# Patient Record
Sex: Female | Born: 1995 | Race: Black or African American | Hispanic: No | Marital: Married | Smoking: Current some day smoker
Health system: Southern US, Community
[De-identification: ages and names within clinical notes are randomized; demographics above are authoritative.]

## PROBLEM LIST (undated history)

## (undated) ENCOUNTER — Inpatient Hospital Stay (HOSPITAL_COMMUNITY): Payer: Self-pay

## (undated) ENCOUNTER — Emergency Department (HOSPITAL_COMMUNITY): Admission: EM | Payer: Medicaid Other | Source: Home / Self Care

## (undated) DIAGNOSIS — D649 Anemia, unspecified: Secondary | ICD-10-CM

## (undated) DIAGNOSIS — N200 Calculus of kidney: Secondary | ICD-10-CM

## (undated) HISTORY — PX: TOOTH EXTRACTION: SUR596

---
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
## 2015-02-24 LAB — OB RESULTS CONSOLE VARICELLA ZOSTER ANTIBODY, IGG: VARICELLA IGG: IMMUNE

## 2015-03-13 ENCOUNTER — Encounter (HOSPITAL_COMMUNITY): Payer: Self-pay | Admitting: *Deleted

## 2015-03-13 ENCOUNTER — Inpatient Hospital Stay (HOSPITAL_COMMUNITY)
Admission: AD | Admit: 2015-03-13 | Discharge: 2015-03-13 | Disposition: A | Discharge status: Home / Self Care | Payer: Medicaid Other | Source: Ambulatory Visit | Attending: Obstetrics and Gynecology | Admitting: Obstetrics and Gynecology

## 2015-03-13 DIAGNOSIS — O9989 Other specified diseases and conditions complicating pregnancy, childbirth and the puerperium: Secondary | ICD-10-CM

## 2015-03-13 DIAGNOSIS — Z3A31 31 weeks gestation of pregnancy: Secondary | ICD-10-CM | POA: Diagnosis not present

## 2015-03-13 DIAGNOSIS — R109 Unspecified abdominal pain: Secondary | ICD-10-CM | POA: Diagnosis not present

## 2015-03-13 DIAGNOSIS — Z87891 Personal history of nicotine dependence: Secondary | ICD-10-CM | POA: Insufficient documentation

## 2015-03-13 DIAGNOSIS — O26899 Other specified pregnancy related conditions, unspecified trimester: Secondary | ICD-10-CM

## 2015-03-13 HISTORY — DX: Anemia, unspecified: D64.9

## 2015-03-13 LAB — URINALYSIS, ROUTINE W REFLEX MICROSCOPIC
Bilirubin Urine: NEGATIVE
Glucose, UA: NEGATIVE mg/dL
Hgb urine dipstick: NEGATIVE
Ketones, ur: NEGATIVE mg/dL
Nitrite: NEGATIVE
PROTEIN: NEGATIVE mg/dL
SPECIFIC GRAVITY, URINE: 1.02 (ref 1.005–1.030)
Urobilinogen, UA: 0.2 mg/dL (ref 0.0–1.0)
pH: 6.5 (ref 5.0–8.0)

## 2015-03-13 LAB — URINE MICROSCOPIC-ADD ON

## 2015-03-13 NOTE — MAU Provider Note (Signed)
History     CSN: [id number]  Arrival date and time: 03/13/15 1258   None     No chief complaint on file.  HPI  19 y.o. G1P0 @ [redacted]w[redacted]d presents to the [place] with c/o of abdominal pain. Denies vaginal bleeding, LOF. Reports good fetal movement    Past Medical History  Diagnosis Date  . Kidney stones   . Anemia     Past Surgical History  Procedure Laterality Date  . Tooth extraction      History reviewed. No pertinent family history.  History  Substance Use Topics  . Smoking status: Former Games developer  . Smokeless tobacco: Not on file  . Alcohol Use: Yes    Allergies: No Known Allergies  Prescriptions prior to admission  Medication Sig Dispense Refill Last Dose  . dicyclomine (BENTYL) 20 MG tablet Take 1 tablet (20 mg total) by mouth every 6 (six) hours as needed for spasms (for abdominal cramping). (Patient not taking: Reported on 10/26/2014) 20 tablet 0   . Doxylamine-Pyridoxine (DICLEGIS) 10-10 MG TBEC Take 10 mg by mouth every 8 (eight) hours as needed. 12 tablet 1   . levofloxacin (LEVAQUIN) 500 MG tablet Take 1 tablet (500 mg total) by mouth daily. (Patient not taking: Reported on 07/29/2014) 7 tablet 0 Completed Course at Unknown time  . ondansetron (ZOFRAN ODT) 8 MG disintegrating tablet Take 1 tablet (8 mg total) by mouth every 8 (eight) hours as needed for nausea or vomiting. (Patient not taking: Reported on 10/26/2014) 20 tablet 0   . promethazine (PHENERGAN) 25 MG tablet Take 0.5-1 tablets (12.5-25 mg total) by mouth every 6 (six) hours as needed for nausea or vomiting. 30 tablet 2 10/23/2014  . traMADol (ULTRAM) 50 MG tablet Take 1 tablet (50 mg total) by mouth every 6 (six) hours as needed. (Patient not taking: Reported on 10/26/2014) 15 tablet 0     Review of Systems  Constitutional: Negative for fever.  Gastrointestinal: Positive for abdominal pain.  All other systems reviewed and are negative.  Physical Exam   Blood pressure 123/62, pulse 110, temperature 98  F (36.7 C), temperature source Oral, resp. rate 18, last menstrual period 08/06/2014.  Physical Exam  Nursing note and vitals reviewed. Constitutional: She is oriented to person, place, and time. She appears well-developed and well-nourished. No distress.  HENT:  Head: Normocephalic and atraumatic.  Cardiovascular: Normal rate.   Respiratory: Effort normal. No respiratory distress.  GI: Soft. There is no tenderness.  Genitourinary: Vagina normal. There is no rash, tenderness, lesion or injury on the right labia. There is no rash, tenderness, lesion or injury on the left labia. No erythema, tenderness or bleeding in the vagina. No foreign body around the vagina. No signs of injury around the vagina. No vaginal discharge found.  Musculoskeletal: Normal range of motion.  Neurological: She is alert and oriented to person, place, and time.  Skin: Skin is warm and dry.  Psychiatric: She has a normal mood and affect. Her behavior is normal. Judgment and thought content normal.  Dilation: Fingertip Effacement (%): Thick Cervical Position: Posterior Station: Ballotable Exam by:: Sarajane Marek, RNC Results for orders placed or performed during the hospital encounter of 03/13/15 (from the past 24 hour(s))  Urinalysis, Routine w reflex microscopic (not at [facility])     Status: Abnormal   Collection Time: 03/13/15  1:08 PM  Result Value Ref Range   Color, Urine YELLOW YELLOW   APPearance CLEAR CLEAR   Specific Gravity, Urine 1.020 1.005 -  1.030   pH 6.5 5.0 - 8.0   Glucose, UA NEGATIVE NEGATIVE mg/dL   Hgb urine dipstick NEGATIVE NEGATIVE   Bilirubin Urine NEGATIVE NEGATIVE   Ketones, ur NEGATIVE NEGATIVE mg/dL   Protein, ur NEGATIVE NEGATIVE mg/dL   Urobilinogen, UA 0.2 0.0 - 1.0 mg/dL   Nitrite NEGATIVE NEGATIVE   Leukocytes, UA SMALL (A) NEGATIVE  Urine microscopic-add on     Status: Abnormal   Collection Time: 03/13/15  1:08 PM  Result Value Ref Range   Squamous Epithelial / LPF RARE RARE    WBC, UA 7-10 <3 WBC/hpf   Bacteria, UA FEW (A) RARE   FHR- category 1 UC- Irregular MAU Course  Procedures  MDM EFM, po hydration, No change in cervical dilation, Pt is feeling better with no complaints of abdominal pain. Encouraged to drink plenty of fluids.  Assessment and Plan  Abdominal pain in pregnancy  Discharge to home Keep next regularly scheduled appt.  Joanne Aguilar 03/13/2015, 1:37 PM

## 2015-03-13 NOTE — MAU Note (Signed)
Urine in lab 

## 2015-03-13 NOTE — MAU Note (Addendum)
C/O constant pain in suprapubic area. "Feels like my bones gonna shatter." Started a couple of week ago. Forgot to mention at her prenatal appt. No changes in vaginal D/C. No bleeding[place]

## 2015-03-13 NOTE — Discharge Instructions (Signed)
Keep your scheduled appointment for prenatal care. Drink 8-10 glasses of water per day. Abdominal Pain During Pregnancy Abdominal pain is common in pregnancy. Most of the time, it does not cause harm. There are many causes of abdominal pain. Some causes are more serious than others. Some of the causes of abdominal pain in pregnancy are easily diagnosed. Occasionally, the diagnosis takes time to understand. Other times, the cause is not determined. Abdominal pain can be a sign that something is very wrong with the pregnancy, or the pain may have nothing to do with the pregnancy at all. For this reason, always tell your health care provider if you have any abdominal discomfort. HOME CARE INSTRUCTIONS  Monitor your abdominal pain for any changes. The following actions may help to alleviate any discomfort you are experiencing:  Do not have sexual intercourse or put anything in your vagina until your symptoms go away completely.  Get plenty of rest until your pain improves.  Drink clear fluids if you feel nauseous. Avoid solid food as long as you are uncomfortable or nauseous.  Only take over-the-counter or prescription medicine as directed by your health care provider.  Keep all follow-up appointments with your health care provider. SEEK IMMEDIATE MEDICAL CARE IF:  You are bleeding, leaking fluid, or passing tissue from the vagina.  You have increasing pain or cramping.  You have persistent vomiting.  You have painful or bloody urination.  You have a fever.  You notice a decrease in your baby's movements.  You have extreme weakness or feel faint.  You have shortness of breath, with or without abdominal pain.  You develop a severe headache with abdominal pain.  You have abnormal vaginal discharge with abdominal pain.  You have persistent diarrhea.  You have abdominal pain that continues even after rest, or gets worse. MAKE SURE YOU:   Understand these instructions.  Will watch  your condition.  Will get help right away if you are not doing well or get worse. Document Released: 08/22/2005 Document Revised: 06/12/2013 Document Reviewed: 03/21/2013 [facility] Patient Information 2015 [place], [place]. This information is not intended to replace advice given to you by your health care provider. Make sure you discuss any questions you have with your health care provider.

## 2015-05-04 ENCOUNTER — Encounter: Payer: Medicaid Other | Admitting: Obstetrics and Gynecology

## 2015-05-06 ENCOUNTER — Inpatient Hospital Stay (HOSPITAL_COMMUNITY)
Admission: AD | Admit: 2015-05-06 | Discharge: 2015-05-06 | Disposition: A | Discharge status: Home / Self Care | Payer: Medicaid Other | Source: Ambulatory Visit | Attending: Obstetrics & Gynecology | Admitting: Obstetrics & Gynecology

## 2015-05-06 ENCOUNTER — Encounter (HOSPITAL_COMMUNITY): Payer: Self-pay | Admitting: *Deleted

## 2015-05-06 DIAGNOSIS — O26893 Other specified pregnancy related conditions, third trimester: Secondary | ICD-10-CM | POA: Diagnosis not present

## 2015-05-06 DIAGNOSIS — Z87442 Personal history of urinary calculi: Secondary | ICD-10-CM | POA: Insufficient documentation

## 2015-05-06 DIAGNOSIS — M25559 Pain in unspecified hip: Secondary | ICD-10-CM | POA: Diagnosis not present

## 2015-05-06 DIAGNOSIS — Z87891 Personal history of nicotine dependence: Secondary | ICD-10-CM | POA: Diagnosis not present

## 2015-05-06 DIAGNOSIS — Z3A39 39 weeks gestation of pregnancy: Secondary | ICD-10-CM | POA: Diagnosis not present

## 2015-05-06 DIAGNOSIS — R102 Pelvic and perineal pain: Secondary | ICD-10-CM | POA: Diagnosis present

## 2015-05-06 DIAGNOSIS — M25551 Pain in right hip: Secondary | ICD-10-CM

## 2015-05-06 DIAGNOSIS — O26899 Other specified pregnancy related conditions, unspecified trimester: Secondary | ICD-10-CM

## 2015-05-06 LAB — URINALYSIS, ROUTINE W REFLEX MICROSCOPIC
Bilirubin Urine: NEGATIVE
GLUCOSE, UA: NEGATIVE mg/dL
Hgb urine dipstick: NEGATIVE
Ketones, ur: NEGATIVE mg/dL
NITRITE: NEGATIVE
PH: 6.5 (ref 5.0–8.0)
Protein, ur: NEGATIVE mg/dL
SPECIFIC GRAVITY, URINE: 1.02 (ref 1.005–1.030)
Urobilinogen, UA: 0.2 mg/dL (ref 0.0–1.0)

## 2015-05-06 LAB — URINE MICROSCOPIC-ADD ON

## 2015-05-06 MED ORDER — PRENATAL VITAMINS 0.8 MG PO TABS
1.0000 | ORAL_TABLET | Freq: Every day | ORAL | Status: DC
Start: 2015-05-06 — End: 2015-05-08

## 2015-05-06 NOTE — MAU Note (Signed)
Pt stated she is having sharp vaginal vaginal pain and hip [pain for a few weeks. Had been getting prenatal care in [facility]. [facility]

## 2015-05-06 NOTE — MAU Provider Note (Signed)
MAU HISTORY AND PHYSICAL  Chief Complaint:  Vaginal Pain and Hip Pain   Joanne Aguilar is a 19 y.o.  G1P0 with IUP at [redacted]w[redacted]d presenting for Vaginal Pain and Hip Pain  Prenatal care at [facility]. Moving to this area and transferring care.  Hip pain present for 1-2 weeks. Right-sided, doesn't radiate. No weakness or numbness. Worse with ambulation, improved with rest. Not worsening.  Experiencing 1 month of sharp pains in groin/vagina. No significant vaginal discharge. No burning. No fever or chills. Worse with movement. No dysuria or hematuria.   Patient states she has been having  none contractions, none vaginal bleeding, intact membranes, with active fetal movement.    Menstrual History: OB History    Gravida Para Term Preterm AB TAB SAB Ectopic Multiple Living   1         0      Patient's last menstrual period was 08/06/2014.      Past Medical History  Diagnosis Date  . Kidney stones   . Anemia     Past Surgical History  Procedure Laterality Date  . Tooth extraction      History reviewed. No pertinent family history.  Social History  Substance Use Topics  . Smoking status: Former Games developer  . Smokeless tobacco: None  . Alcohol Use: Yes     Comment: none since pregnancy     No Known Allergies  Prescriptions prior to admission  Medication Sig Dispense Refill Last Dose  . dicyclomine (BENTYL) 20 MG tablet Take 1 tablet (20 mg total) by mouth every 6 (six) hours as needed for spasms (for abdominal cramping). (Patient not taking: Reported on 10/26/2014) 20 tablet 0   . Doxylamine-Pyridoxine (DICLEGIS) 10-10 MG TBEC Take 10 mg by mouth every 8 (eight) hours as needed. (Patient not taking: Reported on 03/13/2015) 12 tablet 1 Not Taking at Unknown time  . promethazine (PHENERGAN) 25 MG tablet Take 0.5-1 tablets (12.5-25 mg total) by mouth every 6 (six) hours as needed for nausea or vomiting. (Patient not taking: Reported on 03/13/2015) 30 tablet 2 Not Taking at Unknown  time    Review of Systems - Negative except for what is mentioned in HPI.  Physical Exam  Blood pressure 129/76, pulse 115, temperature 98.4 F (36.9 C), temperature source Oral, resp. rate 18, height  (1.6 m), last menstrual period 08/06/2014. GENERAL: Well-developed, well-nourished female in no acute distress.  LUNGS: Clear to auscultation bilaterally.  HEART: Regular rate and rhythm. ABDOMEN: Soft, nontender, nondistended, gravid.  MSK: no greater trochanter tenderness, straight leg negative Neuro: 5/5 lower strength, distal sensation intact, 2+ patellar and achilles reflexes GU: scant discharge, normal cervix, no CMT or adnexal tenderness/fullness EXTREMITIES: Nontender, no edema, 2+ distal pulses. FHT:  130/mod/+a/-d Contractions: none   Labs: No results found for this or any previous visit (from the past 24 hour(s)).  Imaging Studies:  No results found.  Assessment: Joanne Aguilar is  19 y.o. G1P0 at [redacted]w[redacted]d presents with vaginal and hip pain. No neurologic signs symptoms. No constitutional signs/symptoms. Hip pain is mild, think likely secondary to gravidity, low concern for infectious etiology. Vaginal pain appears to be round ligament pain. No symptoms of UTI or vaginitis, and normal SSE.  Plan: - home with belly band, tylenol, warm baths, and infection, prom, and labor precautions - have requested prenatal records and have asked nursing to scan once we receive them - has appointment next week to establish care in our outpatient clinic  Cherrie Gauze [facility] 8/31/201610:11  AM

## 2015-05-08 ENCOUNTER — Inpatient Hospital Stay (HOSPITAL_COMMUNITY)
Admission: AD | Admit: 2015-05-08 | Discharge: 2015-05-09 | DRG: 775 | Disposition: A | Discharge status: Home / Self Care | Payer: Medicaid Other | Source: Ambulatory Visit | Attending: Family Medicine | Admitting: Family Medicine

## 2015-05-08 ENCOUNTER — Encounter (HOSPITAL_COMMUNITY): Payer: Self-pay | Admitting: *Deleted

## 2015-05-08 DIAGNOSIS — Z87891 Personal history of nicotine dependence: Secondary | ICD-10-CM

## 2015-05-08 DIAGNOSIS — Z3A39 39 weeks gestation of pregnancy: Secondary | ICD-10-CM | POA: Diagnosis not present

## 2015-05-08 DIAGNOSIS — Z87442 Personal history of urinary calculi: Secondary | ICD-10-CM

## 2015-05-08 LAB — CBC
HCT: 27.9 % — ABNORMAL LOW (ref 36.0–46.0)
HEMATOCRIT: 28.4 % — AB (ref 36.0–46.0)
HEMOGLOBIN: 9.3 g/dL — AB (ref 12.0–15.0)
Hemoglobin: 9.3 g/dL — ABNORMAL LOW (ref 12.0–15.0)
MCH: 27 pg (ref 26.0–34.0)
MCH: 26.5 pg (ref 26.0–34.0)
MCHC: 33.3 g/dL (ref 30.0–36.0)
MCHC: 32.7 g/dL (ref 30.0–36.0)
MCV: 81.1 fL (ref 78.0–100.0)
MCV: 80.9 fL (ref 78.0–100.0)
PLATELETS: 105 10*3/uL — AB (ref 150–400)
Platelets: 102 10*3/uL — ABNORMAL LOW (ref 150–400)
RBC: 3.44 MIL/uL — ABNORMAL LOW (ref 3.87–5.11)
RBC: 3.51 MIL/uL — ABNORMAL LOW (ref 3.87–5.11)
RDW: 14.7 % (ref 11.5–15.5)
RDW: 14.8 % (ref 11.5–15.5)
WBC: 7 10*3/uL (ref 4.0–10.5)
WBC: 7.5 10*3/uL (ref 4.0–10.5)

## 2015-05-08 LAB — RPR: RPR Ser Ql: NONREACTIVE

## 2015-05-08 LAB — OB RESULTS CONSOLE GBS: GBS: NEGATIVE

## 2015-05-08 LAB — TYPE AND SCREEN
ABO/RH(D): B POS
ANTIBODY SCREEN: NEGATIVE

## 2015-05-08 LAB — AMNISURE RUPTURE OF MEMBRANE (ROM) NOT AT ARMC: Amnisure ROM: POSITIVE

## 2015-05-08 LAB — GROUP B STREP BY PCR: Group B strep by PCR: NEGATIVE

## 2015-05-08 LAB — HEPATITIS B SURFACE ANTIGEN: Hepatitis B Surface Ag: NEGATIVE

## 2015-05-08 LAB — ABO/RH: ABO/RH(D): B POS

## 2015-05-08 MED ORDER — NALOXONE HCL 0.4 MG/ML IJ SOLN
INTRAMUSCULAR | Status: AC
  Filled 2015-05-08: qty 1

## 2015-05-08 MED ORDER — EPHEDRINE 5 MG/ML INJ
10.0000 mg | INTRAVENOUS | Status: DC | PRN
  Filled 2015-05-08: qty 2

## 2015-05-08 MED ORDER — BISACODYL 10 MG RE SUPP: 10.0000 mg | Freq: Every day | RECTAL | Status: DC | PRN

## 2015-05-08 MED ORDER — PROMETHAZINE HCL 25 MG/ML IJ SOLN
25.0000 mg | Freq: Four times a day (QID) | INTRAMUSCULAR | Status: DC | PRN
  Administered 2015-05-08: 25 mg via INTRAVENOUS
  Filled 2015-05-08: qty 1

## 2015-05-08 MED ORDER — OXYCODONE-ACETAMINOPHEN 5-325 MG PO TABS
1.0000 | ORAL_TABLET | ORAL | Status: DC | PRN
  Administered 2015-05-08 – 2015-05-09 (×2): 1 via ORAL
  Filled 2015-05-08: qty 1

## 2015-05-08 MED ORDER — ACETAMINOPHEN 325 MG PO TABS: 650.0000 mg | ORAL_TABLET | ORAL | Status: DC | PRN

## 2015-05-08 MED ORDER — FLEET ENEMA 7-19 GM/118ML RE ENEM
1.0000 | ENEMA | Freq: Every day | RECTAL | Status: DC | PRN
Start: 2015-05-08 — End: 2015-05-09

## 2015-05-08 MED ORDER — OXYTOCIN 40 UNITS IN LACTATED RINGERS INFUSION - SIMPLE MED
62.5000 mL/h | INTRAVENOUS | Status: DC
  Filled 2015-05-08: qty 1000

## 2015-05-08 MED ORDER — DIPHENHYDRAMINE HCL 50 MG/ML IJ SOLN: 12.5000 mg | INTRAMUSCULAR | Status: DC | PRN

## 2015-05-08 MED ORDER — PRENATAL MULTIVITAMIN CH
1.0000 | ORAL_TABLET | Freq: Every day | ORAL | Status: DC
  Administered 2015-05-08 – 2015-05-09 (×2): 1 via ORAL
  Filled 2015-05-08 (×2): qty 1

## 2015-05-08 MED ORDER — ONDANSETRON HCL 4 MG PO TABS: 4.0000 mg | ORAL_TABLET | ORAL | Status: DC | PRN

## 2015-05-08 MED ORDER — ONDANSETRON HCL 4 MG/2ML IJ SOLN: 4.0000 mg | INTRAMUSCULAR | Status: DC | PRN

## 2015-05-08 MED ORDER — OXYCODONE-ACETAMINOPHEN 5-325 MG PO TABS
1.0000 | ORAL_TABLET | ORAL | Status: DC | PRN
  Administered 2015-05-08: 1 via ORAL
  Filled 2015-05-08: qty 1

## 2015-05-08 MED ORDER — LANOLIN HYDROUS EX OINT: TOPICAL_OINTMENT | CUTANEOUS | Status: DC | PRN

## 2015-05-08 MED ORDER — OXYCODONE-ACETAMINOPHEN 5-325 MG PO TABS
2.0000 | ORAL_TABLET | ORAL | Status: DC | PRN
  Filled 2015-05-08 (×2): qty 2

## 2015-05-08 MED ORDER — IBUPROFEN 600 MG PO TABS
600.0000 mg | ORAL_TABLET | Freq: Four times a day (QID) | ORAL | Status: DC
  Administered 2015-05-08 – 2015-05-09 (×5): 600 mg via ORAL
  Filled 2015-05-08 (×5): qty 1

## 2015-05-08 MED ORDER — WITCH HAZEL-GLYCERIN EX PADS: 1.0000 "application " | MEDICATED_PAD | CUTANEOUS | Status: DC | PRN

## 2015-05-08 MED ORDER — MEASLES, MUMPS & RUBELLA VAC ~~LOC~~ INJ: 0.5000 mL | INJECTION | Freq: Once | SUBCUTANEOUS | Status: DC

## 2015-05-08 MED ORDER — FERROUS SULFATE 325 (65 FE) MG PO TABS
325.0000 mg | ORAL_TABLET | Freq: Two times a day (BID) | ORAL | Status: DC
  Administered 2015-05-08 – 2015-05-09 (×2): 325 mg via ORAL
  Filled 2015-05-08 (×2): qty 1

## 2015-05-08 MED ORDER — ZOLPIDEM TARTRATE 5 MG PO TABS: 5.0000 mg | ORAL_TABLET | Freq: Every evening | ORAL | Status: DC | PRN

## 2015-05-08 MED ORDER — BENZOCAINE-MENTHOL 20-0.5 % EX AERO
1.0000 "application " | INHALATION_SPRAY | CUTANEOUS | Status: DC | PRN
  Administered 2015-05-08: 1 via TOPICAL
  Filled 2015-05-08: qty 56

## 2015-05-08 MED ORDER — OXYCODONE-ACETAMINOPHEN 5-325 MG PO TABS: 2.0000 | ORAL_TABLET | ORAL | Status: DC | PRN

## 2015-05-08 MED ORDER — SENNOSIDES-DOCUSATE SODIUM 8.6-50 MG PO TABS
2.0000 | ORAL_TABLET | ORAL | Status: DC
  Administered 2015-05-08: 2 via ORAL
  Filled 2015-05-08: qty 2

## 2015-05-08 MED ORDER — TETANUS-DIPHTH-ACELL PERTUSSIS 5-2.5-18.5 LF-MCG/0.5 IM SUSP
0.5000 mL | Freq: Once | INTRAMUSCULAR | Status: AC
  Administered 2015-05-09: 0.5 mL via INTRAMUSCULAR
  Filled 2015-05-08: qty 0.5

## 2015-05-08 MED ORDER — METHYLERGONOVINE MALEATE 0.2 MG PO TABS: 0.2000 mg | ORAL_TABLET | ORAL | Status: DC | PRN

## 2015-05-08 MED ORDER — OXYTOCIN BOLUS FROM INFUSION
500.0000 mL | INTRAVENOUS | Status: DC
  Administered 2015-05-08: 500 mL via INTRAVENOUS

## 2015-05-08 MED ORDER — PHENYLEPHRINE 40 MCG/ML (10ML) SYRINGE FOR IV PUSH (FOR BLOOD PRESSURE SUPPORT)
80.0000 ug | PREFILLED_SYRINGE | INTRAVENOUS | Status: DC | PRN
  Filled 2015-05-08: qty 2

## 2015-05-08 MED ORDER — OXYTOCIN 40 UNITS IN LACTATED RINGERS INFUSION - SIMPLE MED: 62.5000 mL/h | INTRAVENOUS | Status: DC | PRN

## 2015-05-08 MED ORDER — LACTATED RINGERS IV SOLN
INTRAVENOUS | Status: DC
  Administered 2015-05-08 (×2): via INTRAVENOUS

## 2015-05-08 MED ORDER — DIPHENHYDRAMINE HCL 25 MG PO CAPS: 25.0000 mg | ORAL_CAPSULE | Freq: Four times a day (QID) | ORAL | Status: DC | PRN

## 2015-05-08 MED ORDER — LIDOCAINE HCL (PF) 1 % IJ SOLN
30.0000 mL | INTRAMUSCULAR | Status: DC | PRN
  Administered 2015-05-08: 30 mL via SUBCUTANEOUS
  Filled 2015-05-08: qty 30

## 2015-05-08 MED ORDER — SIMETHICONE 80 MG PO CHEW: 80.0000 mg | CHEWABLE_TABLET | ORAL | Status: DC | PRN

## 2015-05-08 MED ORDER — FENTANYL CITRATE (PF) 100 MCG/2ML IJ SOLN
100.0000 ug | INTRAMUSCULAR | Status: DC | PRN
  Administered 2015-05-08 (×3): 100 ug via INTRAVENOUS
  Filled 2015-05-08 (×3): qty 2

## 2015-05-08 MED ORDER — ONDANSETRON HCL 4 MG/2ML IJ SOLN: 4.0000 mg | Freq: Four times a day (QID) | INTRAMUSCULAR | Status: DC | PRN

## 2015-05-08 MED ORDER — DIBUCAINE 1 % RE OINT: 1.0000 "application " | TOPICAL_OINTMENT | RECTAL | Status: DC | PRN

## 2015-05-08 MED ORDER — METHYLERGONOVINE MALEATE 0.2 MG/ML IJ SOLN: 0.2000 mg | INTRAMUSCULAR | Status: DC | PRN

## 2015-05-08 MED ORDER — LACTATED RINGERS IV SOLN: 500.0000 mL | INTRAVENOUS | Status: DC | PRN

## 2015-05-08 MED ORDER — FENTANYL 2.5 MCG/ML BUPIVACAINE 1/10 % EPIDURAL INFUSION (WH - ANES): 14.0000 mL/h | INTRAMUSCULAR | Status: DC | PRN

## 2015-05-08 MED ORDER — CITRIC ACID-SODIUM CITRATE 334-500 MG/5ML PO SOLN: 30.0000 mL | ORAL | Status: DC | PRN

## 2015-05-08 NOTE — MAU Note (Signed)
PT SAYS  SHE THINKS  SROM AT 0022-  WOKE  UP AND  WAS WET.   PNC-   IN CLINIC - IS  A TRANSFER  FROM  [facility]-  FIRST APPOINTMENT   IS   Tuesday.    NO VE.   DENIES HSV AND MRSA.   GBS- UNSURE.

## 2015-05-08 NOTE — H&P (Signed)
LABOR ADMISSION HISTORY AND PHYSICAL  Joanne Aguilar is a 19 y.o. female G1P0 with IUP at [redacted]w[redacted]d by unknown dating presenting for ROM. She endorses feeling a gush of fluid earlier this evening, with continuous trickling afterwards. She also reports some vaginal bleeding. She reports +FM, + contractions,  no blurry vision, headaches or peripheral edema, and RUQ pain.  She plans on bottle feeding. She request Depo for birth control. Patient has received care in [place], and recently moved to [place]. Her records have not been transferred from her prior prenatal care office.   Dating: By unknown --->  Estimated Date of Delivery: 05/13/15  Sono:  No records available   Prenatal History/Complications:  Past Medical History: Past Medical History  Diagnosis Date  . Kidney stones   . Anemia     Past Surgical History: Past Surgical History  Procedure Laterality Date  . Tooth extraction      Obstetrical History: OB History    Gravida Para Term Preterm AB TAB SAB Ectopic Multiple Living   1         0      Social History: Social History   Social History  . Marital Status: Single    Spouse Name: N/A  . Number of Children: N/A  . Years of Education: N/A   Social History Main Topics  . Smoking status: Former Games developer  . Smokeless tobacco: None  . Alcohol Use: Yes     Comment: none since pregnancy  . Drug Use: Yes    Special: Marijuana     Comment: None since before pregnancy  . Sexual Activity: Yes    Birth Control/ Protection: None   Other Topics Concern  . None   Social History Narrative    Family History: History reviewed. No pertinent family history.  Allergies: No Known Allergies  Prescriptions prior to admission  Medication Sig Dispense Refill Last Dose  . Prenatal Multivit-Min-Fe-FA (PRENATAL VITAMINS) 0.8 MG tablet Take 1 tablet by mouth daily. 30 tablet 12 Past Month at Unknown time     Review of Systems   All systems reviewed and negative except as  stated in HPI  BP 121/75 mmHg  Pulse 86  Temp(Src) 98.2 F (36.8 C) (Oral)  Resp 16  Ht 5\' 3"  (1.6 m)  Wt 155 lb (70.308 kg)  BMI 27.46 kg/m2  LMP 08/06/2014 General appearance: alert, cooperative and mild distress Lungs: clear to auscultation bilaterally Heart: regular rate and rhythm Abdomen: soft, non-tender; bowel sounds normal Pelvic: copious clear fluid mixed with mucous; no blood noted Extremities: no sign of DVT, edema Presentation: cephalic Fetal monitoringBaseline: 135 bpm, Variability: Good {> 6 bpm) and Accelerations: Reactive Uterine activityDate/time of onset: 05/07/15, Frequency: Every 3-5 minutes, Duration: 60 seconds and Intensity: mild Dilation: 3 Effacement (%): 70 Station: -2 Exam by:: DR LANCASTER/ DCALLAWAY,  RN   Prenatal labs: no records available RPR: NON REAC (11/24 0026)  HBsAg:   pending HIV: NONREACTIVE (11/24 0026)  GBS:   pending   Prenatal Transfer Tool  Maternal Substance Abuse:  No Significant Maternal Medications:  None  Results for orders placed or performed during the hospital encounter of 05/08/15 (from the past 24 hour(s))  Amnisure rupture of membrane (rom)not at [facility]   Collection Time: 05/08/15  2:10 AM  Result Value Ref Range   Amnisure ROM POSITIVE     Patient Active Problem List   Diagnosis Date Noted  . Normal labor and delivery 05/08/2015  . Normal delivery at term 05/08/2015  Assessment: Joanne Aguilar is a 19 y.o. G1P0 at [redacted]w[redacted]d here for ROM and SOL.  #Labor: expectant management #Pain: IV pain meds PRN (does not want epidural) #FWB: Category 1 #ID:  GBS pending - will tx with PCN if positive #MOF: Bottle #MOC:Depo  - GBS, Hep B surface antigen pending - Will contact patient's OB in AM to obtain records   Tarri Abernethy, MD PGY-1 [facility]  I have seen and examined this patient and agree the above assessment.    CRESENZO-DISHMAN,Tacie Mccuistion 05/08/2015 8:27 AM

## 2015-05-08 NOTE — MAU Note (Signed)
Pt reports ? Leaking fluid and contractions.

## 2015-05-08 NOTE — Progress Notes (Signed)
Called [facility] at 9:33 AM- unable to speak to provider or RN Ph#[phone] Left message on Rn Line.   Federico Flake, MD

## 2015-05-08 NOTE — Progress Notes (Signed)
Labor Progress Note  Joanne Aguilar is a 19 y.o. G1P0 at [redacted]w[redacted]d  admitted for rupture of membranes  S: Patient pain controlled on fentanyl. Contractions becoming more frequent and stronger.   O:  BP 140/84 mmHg  Pulse 88  Temp(Src) 97.5 F (36.4 C) (Oral)  Resp 16  Ht  (1.6 m)  Wt 155 lb (70.308 kg)  BMI 27.46 kg/m2  LMP 08/06/2014     FHT:  FHR: 140 bpm, variability: moderate,  accelerations:  Present,  decelerations:  Absent UC:   regular, every 2-5 minutes SVE:   Dilation: 3 Effacement (%): 70 Station: -2 Exam by:: DR Lakindra Wible/ DCALLAWAY,  RN SROM/AROM: clear fluid; ruptured before presentation to MAU (possibly around midnight)  Pitocin not yet started   Labs: Lab Results  Component Value Date   WBC 7.0 05/08/2015   HGB 9.3* 05/08/2015   HCT 27.9* 05/08/2015   MCV 81.1 05/08/2015   PLT 105* 05/08/2015    Assessment / Plan: 19 y.o. G1P0 [redacted]w[redacted]d in early labor. Spontaneous labor, progressing normally  Labor: Progressing normally Fetal Wellbeing:  Category I Pain Control:  Fentanyl Anticipated MOD:  NSVD  Expectant management Will contact patient's [facility] provider in AM to obtain records   Tarri Abernethy, MD PGY-1 [facility]

## 2015-05-09 LAB — HIV ANTIBODY (ROUTINE TESTING W REFLEX): HIV Screen 4th Generation wRfx: NONREACTIVE

## 2015-05-09 LAB — RUBELLA SCREEN: RUBELLA: 1.6 {index} (ref >0.99)

## 2015-05-09 MED ORDER — IBUPROFEN 600 MG PO TABS: 600.0000 mg | ORAL_TABLET | Freq: Four times a day (QID) | ORAL | Status: DC | PRN

## 2015-05-09 NOTE — Clinical Social Work Maternal (Signed)
  CLINICAL SOCIAL WORK MATERNAL/CHILD NOTE  Patient Details  Name: Joanne Aguilar MRN: [id number] Date of Birth: 10/23/1995  Date:  05/09/2015  Clinical Social Worker Initiating Note:  Norlene Duel, LCSW Date/ Time Initiated:  05/09/15/1045     Child's Name:  Maudry Diego   Legal Guardian:   (Parents Joanne Aguilar and Joanne Aguilar)   Need for Interpreter:  None   Date of Referral:  05/08/15     Reason for Referral:  Other (Comment)   Referral Source:  [facility]   Address:  [address] [place], [place] [address]  Phone number:   [phone])   Household Members:  Relatives   Natural Supports (not living in the home):  Extended Family, Immediate Family   Professional Supports: None   Employment:  (FOB is employed)   Type of Work:     Education:      Pensions consultant:  [employer]   Other Resources:  [employer]   Cultural/Religious Considerations Which May Impact Care:  none noted  Strengths:  Ability to meet basic needs , Home prepared for child    Risk Factors/Current Problems:   (Hx of marijuana use)   Cognitive State:  Able to Concentrate , Alert    Mood/Affect:  Happy    CSW Assessment:  Acknowledged order for social work consult to assess mother's hx of marijuana use.  Met with mother who was pleasant and receptive to CSW.    She is a single parent with no other dependents.    Her sister was present and attentive.   FOB is employed, but mother states that she is unsure of whether he will support newborn.    She admits to occasional use of marijuana prior to the pregnancy.   She denies any need for treatment.     She denies any hx of alcohol abuse or other illicit drug use.    UDS on newborn is pending.   Mother denies any hx of mental illness.  Informed that she is well prepared at home for newborn.  Mother informed of social work Fish farm manager.    CSW Plan/Description:     Mother informed of the hospital's drug screening policy No barriers to  discharge Will continue to monitor drug screen.  Malisa Ruggiero J, LCSW 05/09/2015, 12:51 PM

## 2015-05-09 NOTE — Discharge Summary (Signed)
Physician Discharge Summary  Patient ID: Joanne Aguilar MRN: [id number] DOB/AGE: [birth year] 19 y.o.  Admit date: 05/08/2015 Discharge date: 05/09/2015  Admission Diagnoses: Spontaneous onset of labor   Discharge Diagnoses:  Principal Problem:   NSVD (normal spontaneous vaginal delivery) Active Problems:   Normal delivery at term   Discharged Condition: good  Hospital Course:   Delivery Note At 7:50 AM a viable female was delivered via Vaginal, Spontaneous Delivery (Presentation: Right Occiput Anterior). APGAR: 9, 9; weight pending,After 3 minutes, the cord was cut and clamped. 40 units of pitocin diluted in 1000cc LR was infused rapidly IV. The placenta separated spontaneously and delivered via CCT and maternal pushing effort. It was inspected and appears to be intact with a 3 VC.   Anesthesia: None  Episiotomy: None Lacerations: 2nd degree;Perineal Suture Repair: 2.0 vicryl Est. Blood Loss (mL): 200  Mom to postpartum. Baby to Couplet care / Skin to Skin.  Joanne Aguilar is a 19 y.o. G1P1001 s/p NSVD.  Patient was admitted in SOL. She had been receiving PNC in [place] but is now living in [place]. No records were able to be obtained from her provider. She has postpartum course that was uncomplicated including no problems with ambulating, PO intake, urination, pain, or bleeding. The pt feels ready to go home and  will be discharged with outpatient follow-up.   Today: No acute events overnight.  Pt denies problems with ambulating, voiding or po intake.  She denies nausea or vomiting.  Pain is well controlled.  She has had flatus. She has not had bowel movement.  Lochia Small.  Plan for birth control is  Depo-Provera.  Method of Feeding: Breast and Bottle.  Discharge Exam: Blood pressure 114/67, pulse 75, temperature 98 F (36.7 C), temperature source Oral, resp. rate 18, height 5\' 3"  (1.6 m), weight 155 lb (70.308 kg), last menstrual period 08/06/2014, unknown if currently  breastfeeding. General: alert, cooperative and no distress Lochia: appropriate Uterine Fundus: firm DVT Evaluation: No evidence of DVT seen on physical exam.  Disposition: 01-Home or Self Care      Discharge Instructions    Activity as tolerated    Complete by:  As directed      Call MD for:  difficulty breathing, headache or visual disturbances    Complete by:  As directed      Call MD for:  extreme fatigue    Complete by:  As directed      Call MD for:  hives    Complete by:  As directed      Call MD for:  persistant dizziness or light-headedness    Complete by:  As directed      Call MD for:  persistant nausea and vomiting    Complete by:  As directed      Call MD for:  severe uncontrolled pain    Complete by:  As directed      Call MD for:  temperature >100.4    Complete by:  As directed      Driving restriction     Complete by:  As directed   Avoid driving for at least 2 weeks.     Lifting restrictions    Complete by:  As directed   Weight restriction of 15 lbs.     Sexual acrtivity    Complete by:  As directed   Pelvic rest for six weeks (no tampons or intercourse)            Medication List  TAKE these medications        ibuprofen 600 MG tablet  Commonly known as:  ADVIL,MOTRIN  Take 1 tablet (600 mg total) by mouth every 6 (six) hours as needed.       Follow-up Information    Follow up with [facility]. Schedule an appointment as soon as possible for a visit in 6 weeks.   Why:  For hospital follow-up   Contact information:   [address] [place] [address] [phone]      Signed: Tarri Abernethy, MD PGY-1 [facility]  05/09/2015, 6:36 AM  CNM attestation I have seen and examined this patient and agree with above documentation in the resident's note.   Joanne Aguilar is a 19 y.o. G1P1001 s/p SVD.   Pain is well controlled.  Plan for birth control is Depo-Provera.  Method of Feeding:  bottle   PE:  BP 114/67 mmHg  Pulse 75  Temp(Src) 98 F (36.7 C) (Oral)  Resp 18  Ht  (1.6 m)  Wt 70.308 kg (155 lb)  BMI 27.46 kg/m2  LMP 08/06/2014  Breastfeeding? Unknown Fundus firm   Recent Labs  05/08/15 0302 05/08/15 0705  HGB 9.3* 9.3*  HCT 27.9* 28.4*     Plan: discharge today - postpartum care discussed - f/u clinic in 6 weeks for postpartum visit- pt is now living in [place] and will be seen at the [facility] for her PP visit  Cam Hai, CNM 8:58 AM  05/09/2015

## 2015-05-09 NOTE — Discharge Instructions (Signed)

## 2015-05-12 ENCOUNTER — Encounter: Payer: Self-pay | Admitting: Advanced Practice Midwife

## 2015-05-13 ENCOUNTER — Encounter: Payer: Self-pay | Admitting: Family

## 2015-05-13 ENCOUNTER — Ambulatory Visit (INDEPENDENT_AMBULATORY_CARE_PROVIDER_SITE_OTHER): Payer: Medicaid Other | Admitting: Family

## 2015-05-13 VITALS — BP 134/89 | HR 72 | Ht 62.4 in | Wt 132.2 lb

## 2015-05-13 DIAGNOSIS — Z3202 Encounter for pregnancy test, result negative: Secondary | ICD-10-CM

## 2015-05-13 DIAGNOSIS — Z3009 Encounter for other general counseling and advice on contraception: Secondary | ICD-10-CM | POA: Diagnosis not present

## 2015-05-13 DIAGNOSIS — Z3049 Encounter for surveillance of other contraceptives: Secondary | ICD-10-CM | POA: Diagnosis not present

## 2015-05-13 DIAGNOSIS — Z113 Encounter for screening for infections with a predominantly sexual mode of transmission: Secondary | ICD-10-CM | POA: Diagnosis not present

## 2015-05-13 DIAGNOSIS — Z3042 Encounter for surveillance of injectable contraceptive: Secondary | ICD-10-CM

## 2015-05-13 MED ORDER — MEDROXYPROGESTERONE ACETATE 150 MG/ML IM SUSP
150.0000 mg | Freq: Once | INTRAMUSCULAR | Status: AC
  Administered 2015-05-13: 150 mg via INTRAMUSCULAR

## 2015-05-13 NOTE — Progress Notes (Signed)
Adolescent Medicine Consultation Initial Visit Joanne Aguilar  is a 19 y.o. female referred by No PCP Per Patient here today for evaluation of contraception management; she was visiting   Previsit planning completed:  no  Growth Chart Viewed? no   PCP Confirmed?  no   History was provided by the patient.  HPI:  Had 2 shots of Depo previously, stopped taking it because she was not having sex.  Delivered SVD baby girl on 05/08/15 without incident. PMH significant only for SVD and kidney stones prior to pregnancy.  She presents today with daughter for the NB check; she inquires about Depo today.  She is breastfeeding.  She is not interested in Nexplanon or IUD at this time; would like Depo.  She has not had intercourse since delivery. No other complaints or pains at this time.  No FH or PMH of cancers, DVTs, clotting disorders, or migraines.   No LMP recorded. - She is 5 days postpartum.   Review of Systems  Constitutional: Negative.   HENT: Negative.   Eyes: Negative.   Respiratory: Negative.   Cardiovascular: Negative.   Gastrointestinal: Negative.   Genitourinary: Negative.        Describes normal lochia   Musculoskeletal: Negative.   Skin: Negative.   Neurological: Negative.   Endo/Heme/Allergies: Negative.   Psychiatric/Behavioral: Negative.     The following portions of the patient's history were reviewed and updated as appropriate: allergies, current medications, past family history, past medical history, past social history, past surgical history and problem list.  No Known Allergies  Past Medical History:   Past Medical History  Diagnosis Date  . Kidney stones   . Anemia     Family History: Not contributory; no contraindications for Depo today.   Social History: Lives with: mom and baby  Parental relations: good Siblings: sisters (3) Friends/Peers: not many School: not currently, went to [employer] Plans: work, no idea where at this  time Nutrition/Eating Behaviors: regular, no issues Sports/Exercise:  walking Screen time: phone and TV, throughout day (on phone during OV) Sleep: sleeping when baby sleeps  Confidentiality was discussed with the patient and if applicable, with caregiver as well.  Patient's personal or confidential phone number: [phone] Tobacco? no Secondhand smoke exposure?no Drugs/EtOH?no Sexually active?no Pregnancy Prevention: Depo today, she is postpartum, reviewed condoms & plan B Safe at home, in school & in relationships? Yes Safe to self? Yes  Physical Exam:  Filed Vitals:   05/13/15 1143  Height: 5' 2.4" (1.585 m)  Weight: 132 lb 3.2 oz (59.966 kg)   Ht 5' 2.4" (1.585 m)  Wt 132 lb 3.2 oz (59.966 kg)  BMI 23.87 kg/m2 Body mass index: body mass index is 23.87 kg/(m^2). Blood pressure percentiles are 99% systolic and 99% diastolic based on 2000 NHANES data. Blood pressure percentile targets: 90: 122/77, 95: 125/81, 99 + 5 mmHg: 138/94.  Physical Exam  Constitutional: She is oriented to person, place, and time. She appears well-developed. No distress.  HENT:  Head: Normocephalic and atraumatic.  Eyes: EOM are normal. Pupils are equal, round, and reactive to light. No scleral icterus.  Neck: Normal range of motion. Neck supple. No thyromegaly present.  Cardiovascular: Normal rate, regular rhythm, normal heart sounds and intact distal pulses.   No murmur heard. Pulmonary/Chest: Effort normal and breath sounds normal.  Abdominal: Soft.  Musculoskeletal: Normal range of motion. She exhibits no edema.  Lymphadenopathy:    She has no cervical adenopathy.  Neurological: She is alert and oriented  to person, place, and time. No cranial nerve deficit.  Skin: Skin is warm and dry. No rash noted.  Psychiatric: She has a normal mood and affect. Her behavior is normal. Judgment and thought content normal.    Assessment/Plan: 1. Contraceptive education Discussed options; she desires to  return to Depo.  Discussed any barriers to returning to clinic for future injections; she states she will be able to return.  Information provided for LARCs should she choose to switch methods.  Condoms provided   2. Encounter for management and injection of depo-Provera -- medroxyPROGESTERone (DEPO-PROVERA) injection 150 mg; Inject 1 mL (150 mg total) into the muscle once.  3. Pregnancy examination or test, negative result Deferred due to postpartum x 5 days  4. Screening examination for venereal disease Per protocol; she was negative at time of deliveyr - GC/chlamydia probe amp, urine  5. NSVD (normal spontaneous vaginal delivery) -NB present with her in exam room. Normal bonding noted.    Follow-up:   No Follow-up on file.   Medical decision-making:  >15 minutes spent, more than 50% of appointment was spent discussing diagnosis and management of symptoms

## 2015-05-14 ENCOUNTER — Telehealth: Payer: Self-pay | Admitting: *Deleted

## 2015-05-14 LAB — GC/CHLAMYDIA PROBE AMP, URINE
Chlamydia, Swab/Urine, PCR: NEGATIVE
GC Probe Amp, Urine: NEGATIVE

## 2015-05-14 NOTE — Telephone Encounter (Signed)
TC to pt. Left message with mother requesting pt call office back to discuss labs. Mom agreeable, has phone number.

## 2015-05-14 NOTE — Telephone Encounter (Signed)
-----   Message from Christianne Dolin, NP sent at 05/14/2015  9:32 AM EDT ----- GC/C results are negative. Please let us know if you have any questions.  Keep your next scheduled Depo appointment.

## 2015-05-18 ENCOUNTER — Encounter: Payer: Self-pay | Admitting: *Deleted

## 2015-06-19 ENCOUNTER — Ambulatory Visit: Payer: Medicaid Other | Admitting: Obstetrics and Gynecology

## 2015-07-09 ENCOUNTER — Encounter: Payer: Self-pay | Admitting: Pediatrics

## 2015-07-09 ENCOUNTER — Ambulatory Visit (INDEPENDENT_AMBULATORY_CARE_PROVIDER_SITE_OTHER): Payer: Medicaid Other | Admitting: Pediatrics

## 2015-07-09 VITALS — BP 133/77 | HR 80 | Ht 62.6 in | Wt 130.0 lb

## 2015-07-09 DIAGNOSIS — T384X5A Adverse effect of oral contraceptives, initial encounter: Secondary | ICD-10-CM | POA: Diagnosis not present

## 2015-07-09 DIAGNOSIS — T385X5A Adverse effect of other estrogens and progestogens, initial encounter: Principal | ICD-10-CM

## 2015-07-09 DIAGNOSIS — Z130 Encounter for screening for diseases of the blood and blood-forming organs and certain disorders involving the immune mechanism: Secondary | ICD-10-CM

## 2015-07-09 DIAGNOSIS — N939 Abnormal uterine and vaginal bleeding, unspecified: Secondary | ICD-10-CM

## 2015-07-09 DIAGNOSIS — Z3202 Encounter for pregnancy test, result negative: Secondary | ICD-10-CM | POA: Diagnosis not present

## 2015-07-09 DIAGNOSIS — N921 Excessive and frequent menstruation with irregular cycle: Secondary | ICD-10-CM | POA: Insufficient documentation

## 2015-07-09 LAB — POCT URINE PREGNANCY: Preg Test, Ur: NEGATIVE

## 2015-07-09 LAB — POCT HEMOGLOBIN: HEMOGLOBIN: 10.2 g/dL — AB (ref 12.2–16.2)

## 2015-07-09 MED ORDER — NORETHIN ACE-ETH ESTRAD-FE 1-20 MG-MCG PO TABS: 1.0000 | ORAL_TABLET | Freq: Every day | ORAL | Status: DC

## 2015-07-09 NOTE — Assessment & Plan Note (Signed)
Bleeding seems to have been related to postpartum state and now to breakthrough bleeding on Depo Hemoglobin improving UPT negative and no need to do quant Hcg Bleeding light on vaginal exam Start Junel daily for management of bleeding F/u if bleeding not improved Next Depo shot with RN visit

## 2015-07-09 NOTE — Progress Notes (Signed)
Adolescent Medicine Consultation Follow-Up Visit Joanne Aguilar  is a 19 y.o. female referred by No PCP Per Patient here today for follow-up of vaginal bleeding after receiving Depo provera.   Previsit planning completed:  yes Pre-Visit Planning  Joanne Aguilar  is a 19 y.o. female referred by No PCP Per Patient.   Last seen in Adolescent Medicine Clinic on 06/12/2015 for depoprovera.   Previous Psych Screenings?  n/a  Treatment plan at last visit included contraceptive counseling and depoprovera injection.   Clinical Staff Visit Tasks:   - Urine GC/CT due? no - Psych Screenings Due? no - FS Hgb  Provider Visit Tasks: - Assess bleeding patterns assoc with Depo - Pertinent Labs? no  Growth Chart Viewed? no  PCP Confirmed?  No - patient does not have, OB is [facility] hospital faculty preactice   History was provided by the patient.  HPI:   Since SVD 05/08/15, hasn't stopped bleeding Bleeding got lighter, but now getting heavier She reports using 3 tampons per day Depo shot 05/13/15 Depo previously made her bleed for ~2 weeks No vaginal discharge - not foul smelling 1-2 weeks ago abd cramping, none since Not sexually active since SVD Bottle feeding baby  Patient's last menstrual period was 08/06/2014. - she is 2 months postpartum  The following portions of the patient's history were reviewed and updated as appropriate: allergies, current medications, past family history, past medical history, past social history, past surgical history and problem list.  No Known Allergies  Social History: Sleep:  Getting about 3-4 hours of slep each night, working , baby and sisters make it diffcicult to sleep Eating Habits: eating "everything" Screen Time:  TV and phone throughout the day Exercise: none - sometimes walks to the store School: not currently Future Plans: unsure currently  Confidentiality was discussed with the patient and if applicable, with caregiver as well.  Tobacco?  yes, smoking blacks 0.5 per day Secondhand smoke exposure? no Drugs/EtOH? yes, marijuana about twice per month Sexually active? no Pregnancy Prevention: Depo, reviewed condoms & plan B Safe at home, in school & in relationships? Yes Guns in the home? no Safe to self? Yes  Physical Exam:  Filed Vitals:   07/09/15 0856  BP: 133/77  Pulse: 80  Height: 5' 2.6" (1.59 m)  Weight: 130 lb (58.968 kg)   BP 133/77 mmHg  Pulse 80  Ht 5' 2.6" (1.59 m)  Wt 130 lb (58.968 kg)  BMI 23.33 kg/m2  LMP 08/06/2014  Breastfeeding? Unknown Body mass index: body mass index is 23.33 kg/(m^2). Blood pressure percentiles are 99% systolic and 90% diastolic based on 2000 NHANES data. Blood pressure percentile targets: 90: 122/77, 95: 125/81, 99 + 5 mmHg: 138/93.  Physical Exam  Constitutional: She is oriented to person, place, and time. She appears well-developed and well-nourished. No distress.  HENT:  Head: Normocephalic and atraumatic.  Eyes: EOM are normal.  Cardiovascular: Normal rate, regular rhythm, normal heart sounds and intact distal pulses.   No murmur heard. Pulmonary/Chest: Effort normal and breath sounds normal. No respiratory distress.  Abdominal: Soft. Bowel sounds are normal. She exhibits no distension and no mass. There is no tenderness. There is no rebound and no guarding.  Musculoskeletal: She exhibits no edema or tenderness.  Neurological: She is alert and oriented to person, place, and time.  Skin: Skin is warm and dry.  Psychiatric: She has a normal mood and affect. Her behavior is normal.    Assessment/Plan: Heavy vaginal bleeding due to contraceptive injection  use Bleeding seems to have been related to postpartum state and now to breakthrough bleeding on Depo Hemoglobin improving UPT negative and no need to do quant Hcg Bleeding light on vaginal exam Start Junel daily for management of bleeding F/u if bleeding not improved Next Depo shot with RN visit    Follow-up:   Return for Next Depo 11/23-12/7.   Medical decision-making:  > 25 minutes spent, more than 50% of appointment was spent discussing diagnosis and management of symptoms  Erasmo DownerAngela M Bacigalupo, MD, MPH PGY-2,  [facility] 07/09/2015 10:17 AM

## 2015-07-09 NOTE — Progress Notes (Signed)
Pre-Visit Planning  Joanne Aguilar  is a 19 y.o. female referred by No PCP Per Patient.   Last seen in Adolescent Medicine Clinic on 06/12/2015 for depoprovera.   Previous Psych Screenings?  n/a  Treatment plan at last visit included contraceptive counseling and depoprovera injection.   Clinical Staff Visit Tasks:   - Urine GC/CT due? no - Psych Screenings Due? no - FS Hgb  Provider Visit Tasks: - Assess bleeding patterns assoc with Depo - Pertinent Labs? no

## 2015-08-03 ENCOUNTER — Ambulatory Visit: Payer: Self-pay | Admitting: Family

## 2015-08-03 ENCOUNTER — Encounter: Payer: Self-pay | Admitting: Family

## 2015-08-03 NOTE — Progress Notes (Signed)
Patient ID: Dallie Pilesahodia Lyerly, female   DOB: 1995-11-03, 19 y.o.   MRN: [id number] Pre-Visit Planning  Dallie Pilesahodia Papa  is a G2P1  19 y.o. female referred by No PCP Per Patient.   Last seen in Adolescent Medicine Clinic on 07/09/15 for depoprovera. Bottle feeding.   Previous Psych Screenings?  No  Treatment plan at last visit included Depo.   Clinical Staff Visit Tasks:   - Urine GC/CT due? No, negative screen 05/13/15 - Psych Screenings Due? No -Fs Hgb  Provider Visit Tasks: - Assess bleeding pattern assoc with Depo - Update sexual hx, maternal bonding/parenting concerns - Pertinent Labs? Yes  Lab Results  Component Value Date   HGB 10.2* 07/09/2015

## 2015-10-24 IMAGING — CT CT ABD-PELV W/ CM
2 of 4 series · 6 of 46 positions shown, 8 images · IV contrast (Iodine)
Comparison: CT of the abdomen and pelvis June 04, 2014

CLINICAL DATA: RIGHT lower quadrant pain beginning today. Nausea.
Similar symptoms and Baganz, diagnosed with kidney stones. Mild
vaginal bleeding for 2 days.

EXAM:
CT ABDOMEN AND PELVIS WITH CONTRAST
TECHNIQUE: Multidetector CT imaging of the abdomen and pelvis was performed
using the standard protocol following bolus administration of
intravenous contrast.
CONTRAST:  100mL OMNIPAQUE IOHEXOL 300 MG/ML  SOLN

[Series 206: coronals · coronal · 0.50mm/px · 5 of 62 slices shown, 6 images]
[im 7/62  soft-tissue]
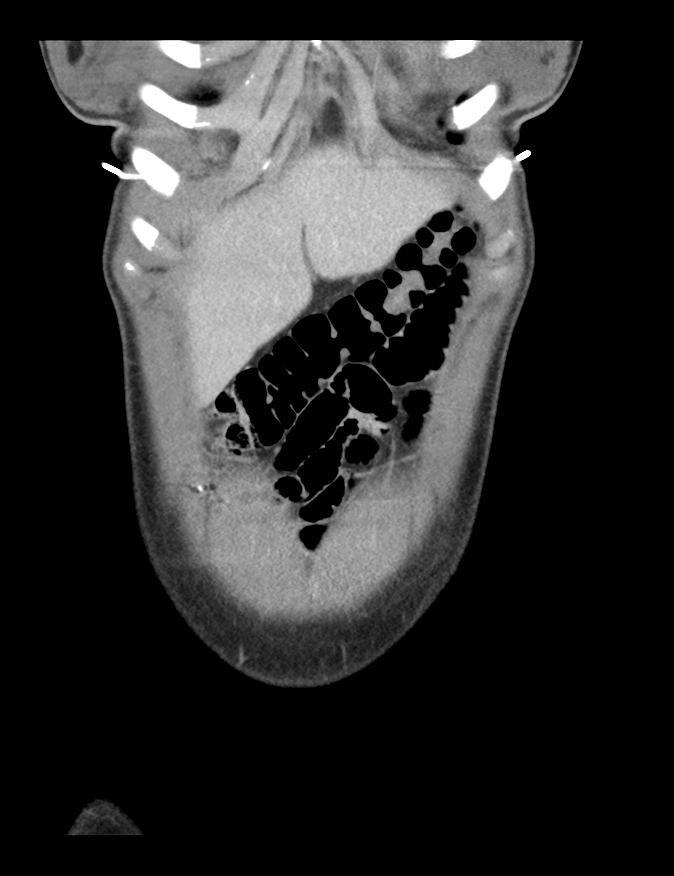
[im 7/62  bone]
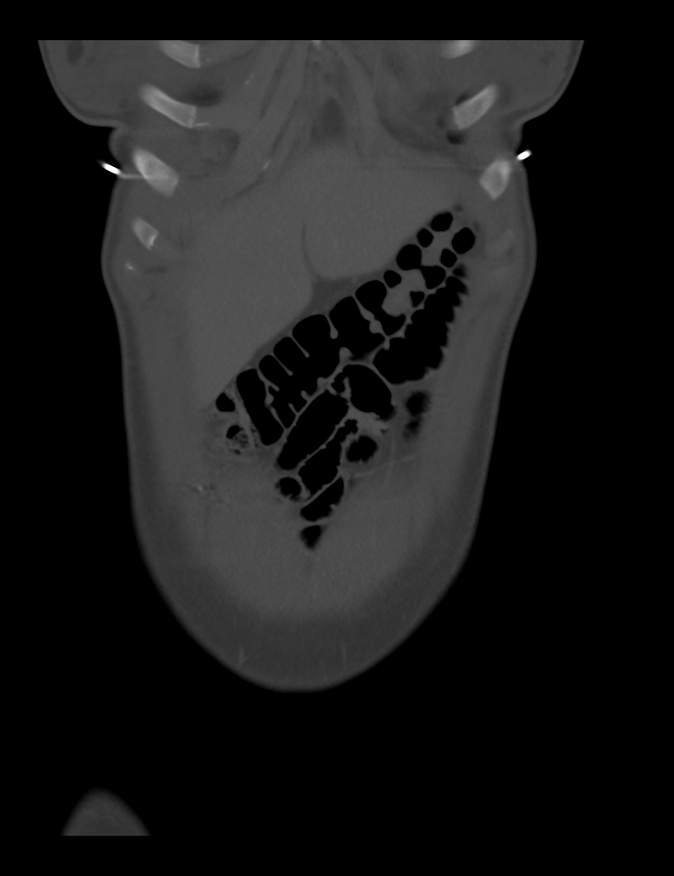
[im 21/62  soft-tissue]
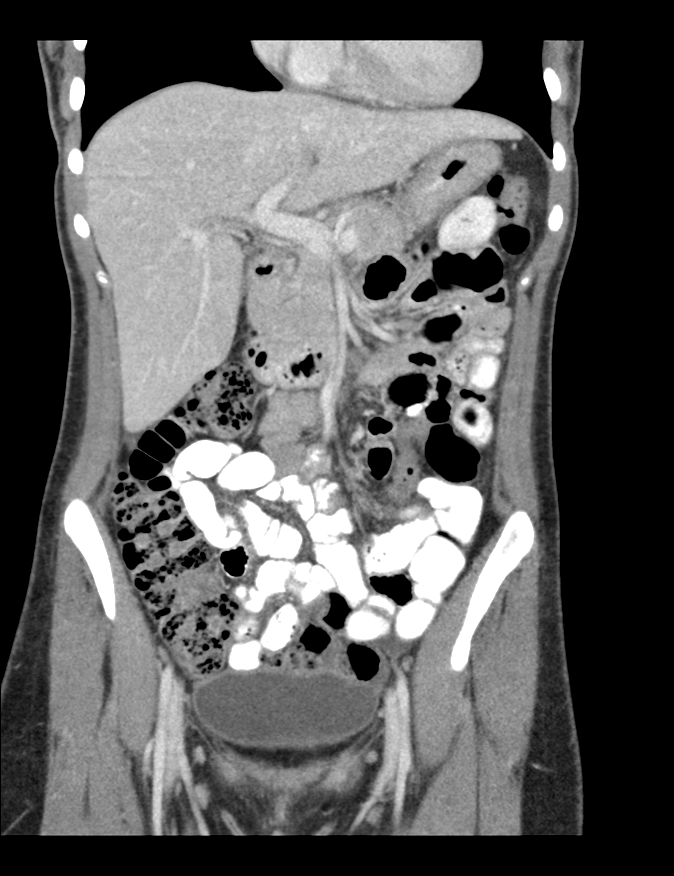
[im 34/62  soft-tissue]
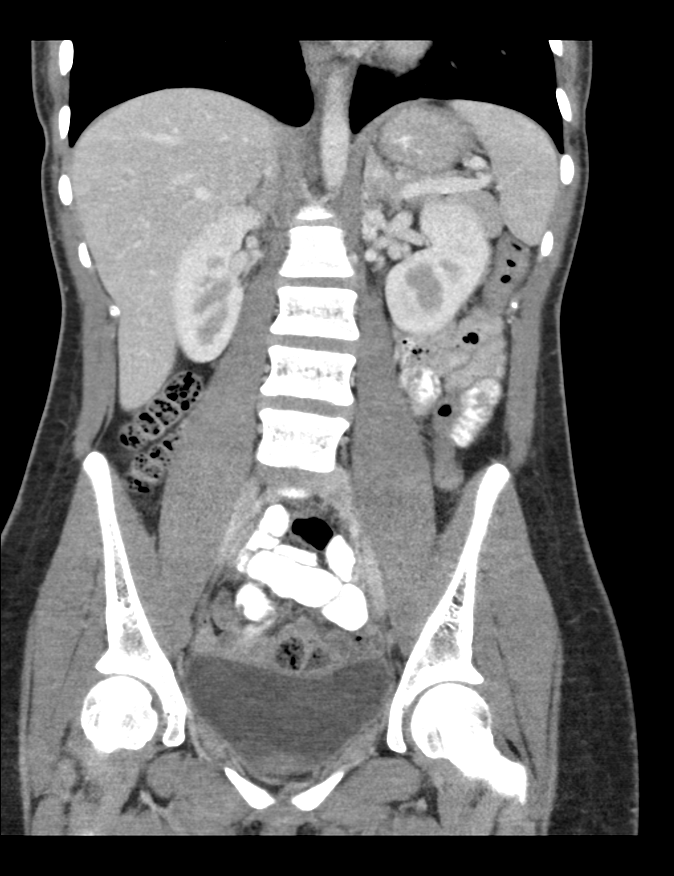
[im 41/62  soft-tissue]
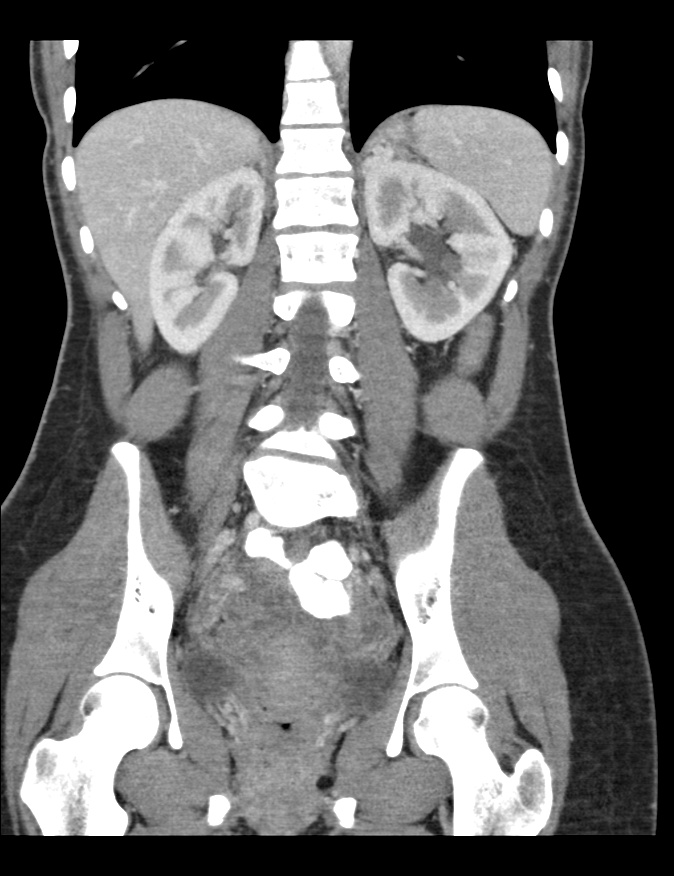
[im 55/62  soft-tissue]
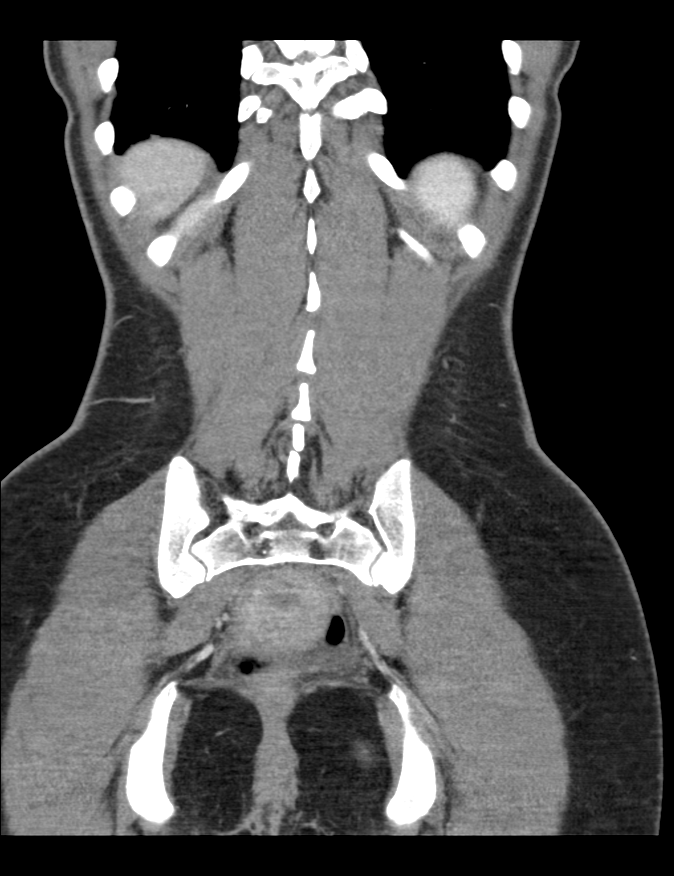

[Series 207: sagittals · sagittal · 0.50mm/px · 1 of 103 slices shown, 2 images]
[im 35/103  soft-tissue]
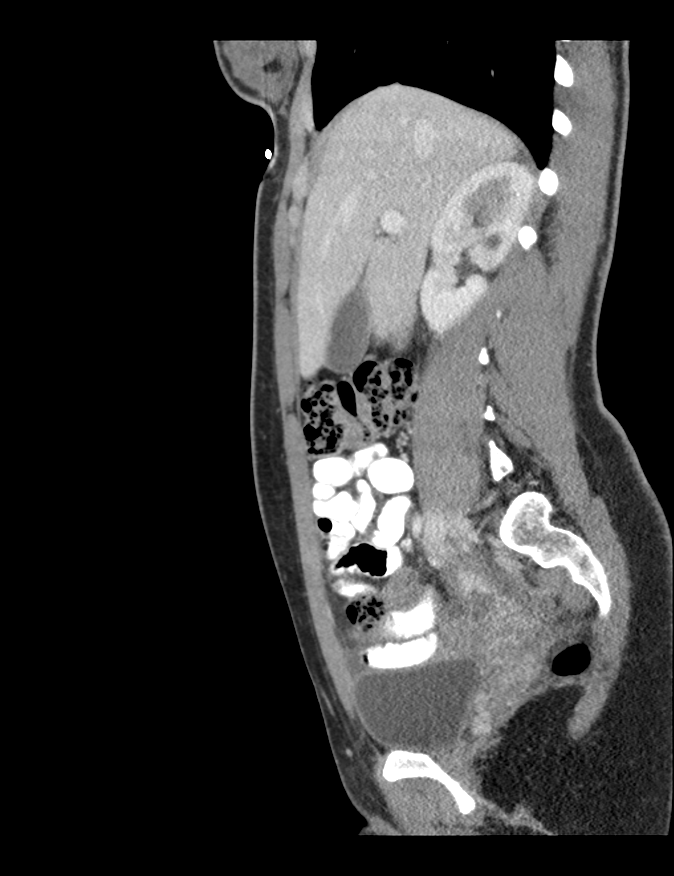
[im 35/103  bone]
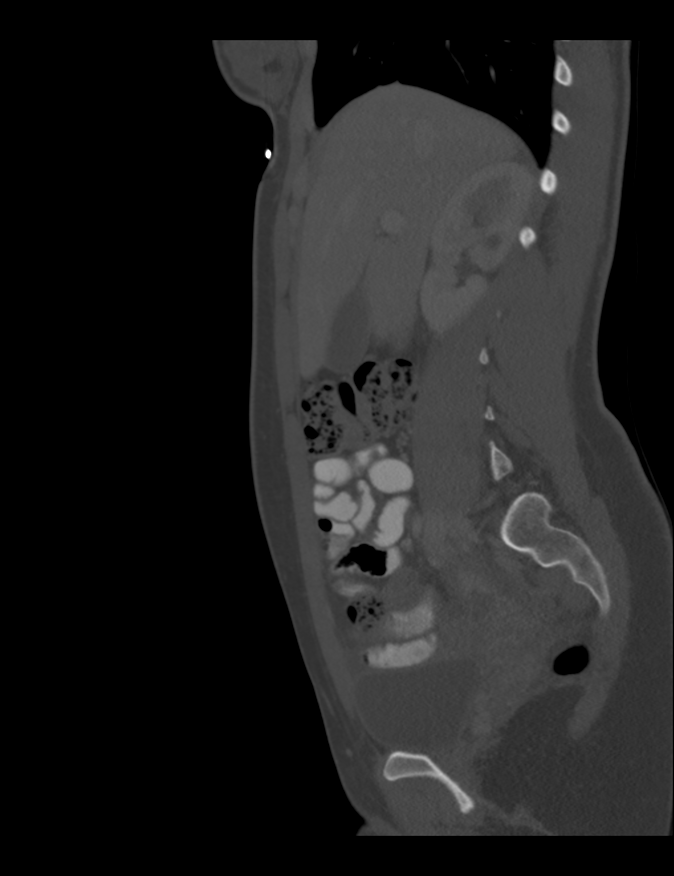

[6 of 46 positions shown; findings below may reference images not displayed]

FINDINGS: LUNG BASES: Included view of the lung bases are clear. Visualized
heart and pericardium are unremarkable.

SOLID ORGANS: The liver, spleen, gallbladder, pancreas and adrenal
glands are unremarkable.

GASTROINTESTINAL TRACT: The stomach, small and large bowel are
normal in course and caliber without inflammatory changes. Normal
visualized portions of the appendix.

KIDNEYS/ URINARY TRACT: Kidneys are orthotopic, demonstrating
symmetric enhancement. 7 x 3 mm RIGHT interpolar nephrolithiasis
again seen. No hydronephrosis or solid renal masses. The unopacified
ureters are normal in course and caliber. Urinary bladder is
partially distended and unremarkable.

PERITONEUM/RETROPERITONEUM: Small amount of free fluid in the pelvis
is likely physiologic. No intraperitoneal free air. Aortoiliac
vessels are normal in course and caliber. No lymphadenopathy by CT
size criteria. Internal reproductive organs are unremarkable.

SOFT TISSUE/OSSEOUS STRUCTURES: Nonsuspicious.
IMPRESSION: Stable appearance of the RIGHT interpolar nonobstructing
nephrolithiasis.

Normal visualized portions of the appendix.

  By: Randevuu Armuand

## 2016-01-05 ENCOUNTER — Ambulatory Visit (INDEPENDENT_AMBULATORY_CARE_PROVIDER_SITE_OTHER): Payer: Medicaid Other | Admitting: Family

## 2016-01-05 ENCOUNTER — Encounter: Payer: Self-pay | Admitting: Family

## 2016-01-05 VITALS — BP 114/75 | HR 75 | Ht 62.21 in | Wt 125.2 lb

## 2016-01-05 DIAGNOSIS — Z309 Encounter for contraceptive management, unspecified: Secondary | ICD-10-CM | POA: Diagnosis not present

## 2016-01-05 DIAGNOSIS — Z30017 Encounter for initial prescription of implantable subdermal contraceptive: Secondary | ICD-10-CM

## 2016-01-05 DIAGNOSIS — Z3049 Encounter for surveillance of other contraceptives: Secondary | ICD-10-CM | POA: Diagnosis not present

## 2016-01-05 DIAGNOSIS — Z789 Other specified health status: Secondary | ICD-10-CM

## 2016-01-05 DIAGNOSIS — Z3202 Encounter for pregnancy test, result negative: Secondary | ICD-10-CM | POA: Diagnosis not present

## 2016-01-05 LAB — POCT URINE PREGNANCY: Preg Test, Ur: NEGATIVE

## 2016-01-05 MED ORDER — NON FORMULARY
1.5000 mg | Freq: Once | Status: AC
  Administered 2016-01-05: 1.5 mg via ORAL

## 2016-01-05 MED ORDER — LEVONORGESTREL 0.75 MG PO TABS: 0.7500 mg | ORAL_TABLET | Freq: Two times a day (BID) | ORAL | Status: DC

## 2016-01-05 MED ORDER — ETONOGESTREL 68 MG ~~LOC~~ IMPL
68.0000 mg | DRUG_IMPLANT | Freq: Once | SUBCUTANEOUS | Status: AC
  Administered 2016-01-05: 68 mg via SUBCUTANEOUS

## 2016-01-05 NOTE — Procedures (Signed)
Nexplanon Insertion  No contraindications for placement.  No liver disease, no unexplained vaginal bleeding, no h/o breast cancer, no h/o blood clots.  Patient's last menstrual period was 12/29/2015.  UHCG: negative   Last Unprotected sex:  2 days ago; Plan B given   Risks & benefits of Nexplanon discussed The nexplanon device was [facility] and [facility]. Packaging instructions supplied to patient Consent form signed  The patient denies any allergies to anesthetics or antiseptics.  Procedure: Pt was placed in supine position. The left arm was flexed at the elbow and externally rotated so that her wrist was parallel to her ear The medial epicondyle of the left arm was identified The insertions site was marked 8 cm proximal to the medial epicondyle The insertion site was cleaned with Betadine The area surrounding the insertion site was covered with a sterile drape 1% lidocaine was injected just under the skin at the insertion site extending 4 cm proximally. The sterile preloaded disposable Nexaplanon applicator was removed from the sterile packaging The applicator needle was inserted at a 30 degree angle at 8 cm proximal to the medial epicondyle as marked The applicator was lowered to a horizontal position and advanced just under the skin for the full length of the needle The slider on the applicator was retracted fully while the applicator remained in the same position, then the applicator was removed. The implant was confirmed via palpation as being in position The implant position was demonstrated to the patient Pressure dressing was applied to the patient.  The patient was instructed to removed the pressure dressing in 24 hrs.  The patient was advised to move slowly from a supine to an upright position  The patient denied any concerns or complaints  The patient was instructed to schedule a follow-up appt in 1 month and to call sooner if any concerns.  The patient  acknowledged agreement and understanding of the plan.

## 2016-01-05 NOTE — Progress Notes (Deleted)
Physical Exam

## 2016-01-11 NOTE — Progress Notes (Signed)
THIS RECORD MAY CONTAIN CONFIDENTIAL INFORMATION THAT SHOULD NOT BE RELEASED WITHOUT REVIEW OF THE SERVICE PROVIDER.  Adolescent Medicine Consultation Follow-Up Visit Joanne Aguilar  is a 20 y.o. female referred by No ref. provider found here today for follow-up.    Previsit planning completed:  no  Growth Chart Viewed? no   History was provided by the patient.  PCP Confirmed?  no  My Chart Activated?   yes   HPI:    Presents today in clinic for a well-child visit for her 448 month old. During that visit, she requested same-day Nexplanon insertion. She then registered and was seen for same -day reproductive health services in Adolescent Medicine.   Referred by Ronalee RedHartsell, MD who saw her baby today to consult with patient regarding contraceptive options.  LMP was reviewed, as well as cycle history.  Sexual history was discussed, including current contraception.  Patient is currently sexually active. Last vaginal sex 2 days ago.  Risks and benefits of First and Second Tier contraceptive options were discussed. Patient verbalized understanding of available contraception choices and desired Nexplanon placement.   Patient's other goals for contraception include long-term birth control option.   Detailed discussion about the unpredictable vaginal bleeding associated with Nexplanon within the first 30 days through the first 6 months of product use was discussed.  Patient verbalized understanding of bleeding and was also educated on signs of worrisome or heavy bleeding that would warrant further follow-up.  Patient was also advised to use back-up contraception for the next 7 days.  Condoms were provided to patient and STI protection was addressed.  Patient had no further questions and procedure was completed per procedure note.   Patient's last menstrual period was 12/29/2015. No Known Allergies Outpatient Prescriptions Prior to Visit  Medication Sig Dispense Refill  . ibuprofen  (ADVIL,MOTRIN) 600 MG tablet Take 1 tablet (600 mg total) by mouth every 6 (six) hours as needed. 90 tablet 1  . norethindrone-ethinyl estradiol (JUNEL FE 1/20) 1-20 MG-MCG tablet Take 1 tablet by mouth daily. (Patient not taking: Reported on 01/05/2016) 1 Package 1   No facility-administered medications prior to visit.     Patient Active Problem List   Diagnosis Date Noted  . Heavy vaginal bleeding due to contraceptive injection use 07/09/2015  . Surveillance for Depo-Provera contraception 05/13/2015  . NSVD (normal spontaneous vaginal delivery) 05/09/2015   Confidentiality was discussed with the patient and if applicable, with caregiver as well.  Patient's personal or confidential phone number: [phone]  The following portions of the patient's history were reviewed and updated as appropriate: allergies, current medications, past family history, past medical history, past social history, past surgical history and problem list.  Physical Exam:  Filed Vitals:   01/05/16 1205  BP: 114/75  Pulse: 75  Height: 5' 2.21" (1.58 m)  Weight: 125 lb 3.2 oz (56.79 kg)   BP 114/75 mmHg  Pulse 75  Ht 5' 2.21" (1.58 m)  Wt 125 lb 3.2 oz (56.79 kg)  BMI 22.75 kg/m2  LMP 12/29/2015 Body mass index: body mass index is 22.75 kg/(m^2). Facility age limit for growth percentiles is 20 years.  Physical Exam  Constitutional: She is oriented to person, place, and time. She appears well-developed and well-nourished. No distress.  HENT:  Head: Normocephalic.  Mouth/Throat: Oropharynx is clear and moist. No oropharyngeal exudate.  Eyes: EOM are normal. Pupils are equal, round, and reactive to light. No scleral icterus.  Cardiovascular: Normal rate.   Pulmonary/Chest: Effort normal.  Musculoskeletal: Normal range  of motion.  Neurological: She is alert and oriented to person, place, and time.  Skin: Skin is warm and dry. No rash noted.  Psychiatric: She has a normal mood and affect.      Assessment/Plan: 1. Encounter for initial prescription of Nexplanon -as per procedure note - etonogestrel (NEXPLANON) implant 68 mg; 68 mg by Subdermal route once. - Subdermal Etonogestrel Implant Insertion; Standing - Subdermal Etonogestrel Implant Insertion  2. Emergency contraception -Plan B given  - NON FORMULARY 1.5 mg; Take 1.5 mg by mouth once.  3. Pregnancy examination or test, negative result -per protocol  - POCT urine pregnancy   Follow-up:  Return in about 4 weeks (around 02/02/2016) for Nexplanon Follow-Up, with Christianne Dolin, FNP-C.   Medical decision-making:  > 25 minutes spent, more than 50% of appointment was spent discussing diagnosis and management of symptoms

## 2016-01-13 ENCOUNTER — Encounter (HOSPITAL_COMMUNITY): Payer: Self-pay | Admitting: Emergency Medicine

## 2016-01-13 ENCOUNTER — Emergency Department (HOSPITAL_COMMUNITY)
Admission: EM | Admit: 2016-01-13 | Discharge: 2016-01-14 | Disposition: A | Discharge status: Home / Self Care | Payer: Medicaid Other | Attending: Emergency Medicine | Admitting: Emergency Medicine

## 2016-01-13 DIAGNOSIS — Z87442 Personal history of urinary calculi: Secondary | ICD-10-CM | POA: Diagnosis not present

## 2016-01-13 DIAGNOSIS — R102 Pelvic and perineal pain: Secondary | ICD-10-CM | POA: Diagnosis not present

## 2016-01-13 DIAGNOSIS — F17200 Nicotine dependence, unspecified, uncomplicated: Secondary | ICD-10-CM | POA: Diagnosis not present

## 2016-01-13 DIAGNOSIS — Z3202 Encounter for pregnancy test, result negative: Secondary | ICD-10-CM | POA: Diagnosis not present

## 2016-01-13 DIAGNOSIS — Z862 Personal history of diseases of the blood and blood-forming organs and certain disorders involving the immune mechanism: Secondary | ICD-10-CM | POA: Insufficient documentation

## 2016-01-13 DIAGNOSIS — N898 Other specified noninflammatory disorders of vagina: Secondary | ICD-10-CM | POA: Diagnosis present

## 2016-01-13 LAB — URINALYSIS, ROUTINE W REFLEX MICROSCOPIC
Bilirubin Urine: NEGATIVE
GLUCOSE, UA: NEGATIVE mg/dL
Ketones, ur: 15 mg/dL — AB
Nitrite: NEGATIVE
PH: 5.5 (ref 5.0–8.0)
PROTEIN: 30 mg/dL — AB
Specific Gravity, Urine: 1.021 (ref 1.005–1.030)

## 2016-01-13 LAB — BASIC METABOLIC PANEL
ANION GAP: 12 (ref 5–15)
BUN: 12 mg/dL (ref 6–20)
CALCIUM: 9.1 mg/dL (ref 8.9–10.3)
CHLORIDE: 104 mmol/L (ref 101–111)
CO2: 19 mmol/L — AB (ref 22–32)
Creatinine, Ser: 0.7 mg/dL (ref 0.44–1.00)
GFR calc Af Amer: >60 mL/min (ref >60)
GFR calc non Af Amer: >60 mL/min (ref >60)
GLUCOSE: 86 mg/dL (ref 65–99)
POTASSIUM: 3.7 mmol/L (ref 3.5–5.1)
Sodium: 135 mmol/L (ref 135–145)

## 2016-01-13 LAB — URINE MICROSCOPIC-ADD ON

## 2016-01-13 LAB — CBC WITH DIFFERENTIAL/PLATELET
BASOS ABS: 0.1 10*3/uL (ref 0.0–0.1)
Basophils Relative: 1 %
Eosinophils Absolute: 0.2 10*3/uL (ref 0.0–0.7)
Eosinophils Relative: 3 %
HEMATOCRIT: 36.8 % (ref 36.0–46.0)
HEMOGLOBIN: 12.2 g/dL (ref 12.0–15.0)
LYMPHS PCT: 40 %
Lymphs Abs: 2.6 10*3/uL (ref 0.7–4.0)
MCH: 28 pg (ref 26.0–34.0)
MCHC: 33.2 g/dL (ref 30.0–36.0)
MCV: 84.6 fL (ref 78.0–100.0)
MONO ABS: 0.4 10*3/uL (ref 0.1–1.0)
Monocytes Relative: 6 %
NEUTROS ABS: 3.1 10*3/uL (ref 1.7–7.7)
NEUTROS PCT: 50 %
Platelets: 192 10*3/uL (ref 150–400)
RBC: 4.35 MIL/uL (ref 3.87–5.11)
RDW: 13.9 % (ref 11.5–15.5)
WBC: 6.3 10*3/uL (ref 4.0–10.5)

## 2016-01-13 LAB — PREGNANCY, URINE: PREG TEST UR: NEGATIVE

## 2016-01-13 NOTE — ED Notes (Signed)
Pt. reports thick vaginal discharge with itching and irritation onset this week , denies dysurias / no fever .

## 2016-01-14 LAB — WET PREP, GENITAL
Sperm: NONE SEEN
TRICH WET PREP: NONE SEEN
YEAST WET PREP: NONE SEEN

## 2016-01-14 LAB — GC/CHLAMYDIA PROBE AMP (~~LOC~~) NOT AT ARMC
CHLAMYDIA, DNA PROBE: NEGATIVE
Neisseria Gonorrhea: NEGATIVE

## 2016-01-14 NOTE — Discharge Instructions (Signed)
FOLLOW UP WITH YOUR DOCTOR IF SYMPTOMS PERSIST OR WORSEN.

## 2016-01-14 NOTE — ED Provider Notes (Signed)
CSN: [id number]     Arrival date & time 01/13/16  2223 History   First MD Initiated Contact with Patient 01/13/16 2339     Chief Complaint  Patient presents with  . Vaginal Discharge     (Consider location/radiation/quality/duration/timing/severity/associated sxs/prior Treatment) Patient is a 20 y.o. female presenting with vaginal discharge. The history is provided by the patient. No language interpreter was used.  Vaginal Discharge Quality:  Thick and yellow Severity:  Moderate Onset quality:  Gradual Duration:  3 days Timing:  Constant Associated symptoms: no abdominal pain, no dysuria, no fever, no nausea and no vomiting   Associated symptoms comment:  Patient presents with vaginal discharge described as thick, yellow discharge. She complains also of vaginal pain without rash or blisters. No abdominal pain, nausea, vomiting or fever.    Past Medical History  Diagnosis Date  . Kidney stones   . Anemia    Past Surgical History  Procedure Laterality Date  . Tooth extraction     No family history on file. Social History  Substance Use Topics  . Smoking status: Current Some Day Smoker    Last Attempt to Quit: 09/06/2014  . Smokeless tobacco: None  . Alcohol Use: Yes   OB History    Gravida Para Term Preterm AB TAB SAB Ectopic Multiple Living   2 1 1       0 1     Review of Systems  Constitutional: Negative for fever.  Gastrointestinal: Negative.  Negative for nausea, vomiting and abdominal pain.  Genitourinary: Positive for vaginal discharge and vaginal pain. Negative for dysuria.  Musculoskeletal: Negative for myalgias.      Allergies  Review of patient's allergies indicates no known allergies.  Home Medications   Prior to Admission medications   Medication Sig Start Date End Date Taking? Authorizing Provider  ibuprofen (ADVIL,MOTRIN) 600 MG tablet Take 1 tablet (600 mg total) by mouth every 6 (six) hours as needed. 05/09/15   Marquette Saa, MD   norethindrone-ethinyl estradiol (JUNEL FE 1/20) 1-20 MG-MCG tablet Take 1 tablet by mouth daily. Patient not taking: Reported on 01/05/2016 07/09/15   Erasmo Downer, MD   BP 124/91 mmHg  Pulse 69  Temp(Src) 98.4 F (36.9 C) (Oral)  Resp 20  SpO2 98%  LMP 12/29/2015 Physical Exam  Constitutional: She is oriented to person, place, and time. She appears well-developed and well-nourished. No distress.  Abdominal: Soft. There is no tenderness.  Genitourinary:  External vaginal exam showing no rash or blistering. No redness. No vulvar or labial swelling. Cervix is unremarkable in appearance. No visualized discharge. Minimal cervical bleeding. No CMT or adnexal tenderness. Tender vaginal introitus.  Musculoskeletal: Normal range of motion.  Neurological: She is alert and oriented to person, place, and time.  Skin: Skin is warm and dry.    ED Course  Procedures (including critical care time) Labs Review Labs Reviewed  WET PREP, GENITAL - Abnormal; Notable for the following:    Clue Cells Wet Prep HPF POC PRESENT (*)    WBC, Wet Prep HPF POC MANY (*)    All other components within normal limits  URINALYSIS, ROUTINE W REFLEX MICROSCOPIC (NOT AT [facility]) - Abnormal; Notable for the following:    Hgb urine dipstick LARGE (*)    Ketones, ur 15 (*)    Protein, ur 30 (*)    Leukocytes, UA SMALL (*)    All other components within normal limits  BASIC METABOLIC PANEL - Abnormal; Notable for the following:  CO2 19 (*)    All other components within normal limits  URINE MICROSCOPIC-ADD ON - Abnormal; Notable for the following:    Squamous Epithelial / LPF 0-5 (*)    Bacteria, UA RARE (*)    All other components within normal limits  CBC WITH DIFFERENTIAL/PLATELET  PREGNANCY, URINE  POC URINE PREG, ED  GC/CHLAMYDIA PROBE AMP ([place]) NOT AT [facility]   Results for orders placed or performed during the hospital encounter of 01/13/16  Wet prep, genital  Result Value Ref Range   Yeast  Wet Prep HPF POC NONE SEEN NONE SEEN   Trich, Wet Prep NONE SEEN NONE SEEN   Clue Cells Wet Prep HPF POC PRESENT (A) NONE SEEN   WBC, Wet Prep HPF POC MANY (A) NONE SEEN   Sperm NONE SEEN   Urinalysis, Routine w reflex microscopic (not at [facility])  Result Value Ref Range   Color, Urine YELLOW YELLOW   APPearance CLEAR CLEAR   Specific Gravity, Urine 1.021 1.005 - 1.030   pH 5.5 5.0 - 8.0   Glucose, UA NEGATIVE NEGATIVE mg/dL   Hgb urine dipstick LARGE (A) NEGATIVE   Bilirubin Urine NEGATIVE NEGATIVE   Ketones, ur 15 (A) NEGATIVE mg/dL   Protein, ur 30 (A) NEGATIVE mg/dL   Nitrite NEGATIVE NEGATIVE   Leukocytes, UA SMALL (A) NEGATIVE  CBC with Differential  Result Value Ref Range   WBC 6.3 4.0 - 10.5 K/uL   RBC 4.35 3.87 - 5.11 MIL/uL   Hemoglobin 12.2 12.0 - 15.0 g/dL   HCT 16.1 09.6 - 04.5 %   MCV 84.6 78.0 - 100.0 fL   MCH 28.0 26.0 - 34.0 pg   MCHC 33.2 30.0 - 36.0 g/dL   RDW 40.9 81.1 - 91.4 %   Platelets 192 150 - 400 K/uL   Neutrophils Relative % 50 %   Neutro Abs 3.1 1.7 - 7.7 K/uL   Lymphocytes Relative 40 %   Lymphs Abs 2.6 0.7 - 4.0 K/uL   Monocytes Relative 6 %   Monocytes Absolute 0.4 0.1 - 1.0 K/uL   Eosinophils Relative 3 %   Eosinophils Absolute 0.2 0.0 - 0.7 K/uL   Basophils Relative 1 %   Basophils Absolute 0.1 0.0 - 0.1 K/uL  Basic metabolic panel  Result Value Ref Range   Sodium 135 135 - 145 mmol/L   Potassium 3.7 3.5 - 5.1 mmol/L   Chloride 104 101 - 111 mmol/L   CO2 19 (L) 22 - 32 mmol/L   Glucose, Bld 86 65 - 99 mg/dL   BUN 12 6 - 20 mg/dL   Creatinine, Ser 7.82 0.44 - 1.00 mg/dL   Calcium 9.1 8.9 - 95.6 mg/dL   GFR calc non Af Amer >60 >60 mL/min   GFR calc Af Amer >60 >60 mL/min   Anion gap 12 5 - 15  Urine microscopic-add on  Result Value Ref Range   Squamous Epithelial / LPF 0-5 (A) NONE SEEN   WBC, UA 0-5 0 - 5 WBC/hpf   RBC / HPF 0-5 0 - 5 RBC/hpf   Bacteria, UA RARE (A) NONE SEEN  Pregnancy, urine  Result Value Ref Range   Preg  Test, Ur NEGATIVE NEGATIVE    Imaging Review No results found. I have personally reviewed and evaluated these images and lab results as part of my medical decision-making.   EKG Interpretation None      MDM   Final diagnoses:  None    1. Vaginal  pain  The patient presents with complaint of thick vaginal discharge and external vaginal pain. She has no discharge on exam, no signs of PID and no external vaginal exam abnormalities. She recently had a nexplanon implanted and was told she may have side effects similar to presentation. No evidence infection. Abx offered given her history of discharge but patient declined, opting to wait for culture results. She is encouraged to follow up with her doctor if symptoms persist.     Elpidio AnisShari Peregrine Nolt, PA-C 01/14/16 [id number]  Pricilla LovelessScott Goldston, MD 01/14/16 1520

## 2016-02-08 ENCOUNTER — Ambulatory Visit: Payer: Self-pay | Admitting: Family

## 2016-03-31 ENCOUNTER — Encounter: Payer: Self-pay | Admitting: Pediatrics

## 2016-04-13 ENCOUNTER — Emergency Department (HOSPITAL_COMMUNITY)
Admission: EM | Admit: 2016-04-13 | Discharge: 2016-04-14 | Disposition: A | Discharge status: Home / Self Care | Payer: Medicaid Other | Attending: Emergency Medicine | Admitting: Emergency Medicine

## 2016-04-13 ENCOUNTER — Encounter (HOSPITAL_COMMUNITY): Payer: Self-pay | Admitting: Emergency Medicine

## 2016-04-13 DIAGNOSIS — N939 Abnormal uterine and vaginal bleeding, unspecified: Secondary | ICD-10-CM

## 2016-04-13 DIAGNOSIS — N390 Urinary tract infection, site not specified: Secondary | ICD-10-CM | POA: Diagnosis not present

## 2016-04-13 DIAGNOSIS — F17210 Nicotine dependence, cigarettes, uncomplicated: Secondary | ICD-10-CM | POA: Insufficient documentation

## 2016-04-13 DIAGNOSIS — R109 Unspecified abdominal pain: Secondary | ICD-10-CM | POA: Diagnosis present

## 2016-04-13 LAB — CBC
HEMATOCRIT: 40.5 % (ref 36.0–46.0)
Hemoglobin: 13.6 g/dL (ref 12.0–15.0)
MCH: 28.8 pg (ref 26.0–34.0)
MCHC: 33.6 g/dL (ref 30.0–36.0)
MCV: 85.8 fL (ref 78.0–100.0)
Platelets: 169 10*3/uL (ref 150–400)
RBC: 4.72 MIL/uL (ref 3.87–5.11)
RDW: 14.4 % (ref 11.5–15.5)
WBC: 5.5 10*3/uL (ref 4.0–10.5)

## 2016-04-13 NOTE — ED Triage Notes (Signed)
Pt. reports low abdominal pain with emesis onset today , denies diarrhea / no fever or chills.

## 2016-04-14 LAB — URINE MICROSCOPIC-ADD ON

## 2016-04-14 LAB — WET PREP, GENITAL
Trich, Wet Prep: NONE SEEN
Yeast Wet Prep HPF POC: NONE SEEN

## 2016-04-14 LAB — URINALYSIS, ROUTINE W REFLEX MICROSCOPIC
Bilirubin Urine: NEGATIVE
Glucose, UA: NEGATIVE mg/dL
Ketones, ur: 15 mg/dL — AB
Leukocytes, UA: NEGATIVE
NITRITE: POSITIVE — AB
PH: 5.5 (ref 5.0–8.0)
PROTEIN: 30 mg/dL — AB
SPECIFIC GRAVITY, URINE: 1.021 (ref 1.005–1.030)

## 2016-04-14 LAB — COMPREHENSIVE METABOLIC PANEL
ALT: 13 U/L — AB (ref 14–54)
AST: 17 U/L (ref 15–41)
Albumin: 4.5 g/dL (ref 3.5–5.0)
Alkaline Phosphatase: 68 U/L (ref 38–126)
Anion gap: 10 (ref 5–15)
BUN: 7 mg/dL (ref 6–20)
CHLORIDE: 104 mmol/L (ref 101–111)
CO2: 22 mmol/L (ref 22–32)
CREATININE: 0.64 mg/dL (ref 0.44–1.00)
Calcium: 9.5 mg/dL (ref 8.9–10.3)
GFR calc non Af Amer: >60 mL/min (ref >60)
Glucose, Bld: 86 mg/dL (ref 65–99)
Potassium: 3.3 mmol/L — ABNORMAL LOW (ref 3.5–5.1)
SODIUM: 136 mmol/L (ref 135–145)
Total Bilirubin: 1 mg/dL (ref 0.3–1.2)
Total Protein: 7.8 g/dL (ref 6.5–8.1)

## 2016-04-14 LAB — POC URINE PREG, ED: PREG TEST UR: NEGATIVE

## 2016-04-14 LAB — GC/CHLAMYDIA PROBE AMP (~~LOC~~) NOT AT ARMC
Chlamydia: NEGATIVE
Neisseria Gonorrhea: NEGATIVE

## 2016-04-14 LAB — LIPASE, BLOOD: LIPASE: 26 U/L (ref 11–51)

## 2016-04-14 MED ORDER — ONDANSETRON HCL 4 MG PO TABS: 4.0000 mg | ORAL_TABLET | Freq: Four times a day (QID) | ORAL | 0 refills | Status: DC

## 2016-04-14 MED ORDER — SULFAMETHOXAZOLE-TRIMETHOPRIM 800-160 MG PO TABS: 1.0000 | ORAL_TABLET | Freq: Two times a day (BID) | ORAL | 0 refills | Status: AC

## 2016-04-14 NOTE — ED Notes (Signed)
Patient able to ambulate independently  

## 2016-04-14 NOTE — Discharge Instructions (Signed)
We are treating your urinary tract infection with antibiotics and your nausea with medication. You will need to follow up with your doctor to discuss your abnormal vaginal bleeding that is most likely caused by the implant.

## 2016-04-14 NOTE — ED Notes (Signed)
Signature pad in room not functioning.  Pt verbalized understanding of all discharge paperwork and prescriptions.  All questions answered.

## 2016-04-14 NOTE — ED Provider Notes (Signed)
[facility]-EMERGENCY DEPT Provider Note   CSN: [id number] Arrival date & time: 04/13/16  2258  First Provider Contact:  None       History   Chief Complaint Chief Complaint  Patient presents with  . Abdominal Pain  . Emesis    HPI Joanne Aguilar is a 20 y.o. female who presents to the ED with abdominal pain.   The history is provided by the patient. No language interpreter was used.  Abdominal Pain   This is a new problem. The current episode started more than 1 week ago. The problem has been gradually worsening. The quality of the pain is sharp and cramping. The pain is at a severity of 5/10. Associated symptoms include nausea, vomiting and frequency. Pertinent negatives include fever, diarrhea, constipation and dysuria. Nothing aggravates the symptoms. Nothing relieves the symptoms.  Emesis   Associated symptoms include abdominal pain. Pertinent negatives include no cough, no diarrhea and no fever.    Past Medical History:  Diagnosis Date  . Anemia   . Kidney stones     Patient Active Problem List   Diagnosis Date Noted  . Heavy vaginal bleeding due to contraceptive injection use 07/09/2015  . Surveillance for Depo-Provera contraception 05/13/2015  . NSVD (normal spontaneous vaginal delivery) 05/09/2015    Past Surgical History:  Procedure Laterality Date  . TOOTH EXTRACTION      OB History    Gravida Para Term Preterm AB Living   2 1 1     1    SAB TAB Ectopic Multiple Live Births         0 1       Home Medications    Prior to Admission medications   Medication Sig Start Date End Date Taking? Authorizing Provider  ibuprofen (ADVIL,MOTRIN) 600 MG tablet Take 1 tablet (600 mg total) by mouth every 6 (six) hours as needed. 05/09/15   Marquette SaaAbigail Joseph Lancaster, MD  norethindrone-ethinyl estradiol (JUNEL FE 1/20) 1-20 MG-MCG tablet Take 1 tablet by mouth daily. Patient not taking: Reported on 01/05/2016 07/09/15   Erasmo DownerAngela M Bacigalupo, MD  ondansetron (ZOFRAN) 4 MG  tablet Take 1 tablet (4 mg total) by mouth every 6 (six) hours. 04/14/16   Hope Orlene OchM Neese, NP  sulfamethoxazole-trimethoprim (BACTRIM DS,SEPTRA DS) 800-160 MG tablet Take 1 tablet by mouth 2 (two) times daily. 04/14/16 04/21/16  Hope Orlene OchM Neese, NP    Family History No family history on file.  Social History Social History  Substance Use Topics  . Smoking status: Current Some Day Smoker    Types: Cigarettes  . Smokeless tobacco: Not on file  . Alcohol use Yes     Allergies   Review of patient's allergies indicates no known allergies.   Review of Systems Review of Systems  Constitutional: Negative for fever.  Eyes: Negative for visual disturbance.  Respiratory: Negative for cough, shortness of breath and wheezing.   Cardiovascular: Negative for chest pain and palpitations.  Gastrointestinal: Positive for abdominal pain, nausea and vomiting. Negative for constipation and diarrhea.  Genitourinary: Positive for frequency, menstrual problem and vaginal bleeding. Negative for dysuria.  Neurological: Negative for dizziness, syncope and light-headedness.  Psychiatric/Behavioral: Negative for confusion. The patient is not nervous/anxious.      Physical Exam Updated Vital Signs BP 148/85 (BP Location: Left Arm)   Pulse 89   Temp 98.5 F (36.9 C) (Oral)   Resp 16   Ht 5\' 2"  (1.575 m)   Wt 54.4 kg   LMP 03/30/2016 (Approximate)  SpO2 100%   BMI 21.95 kg/m   Physical Exam  Constitutional: She is oriented to person, place, and time. She appears well-developed and well-nourished. No distress.  HENT:  Head: Normocephalic.  Eyes: EOM are normal.  Neck: Neck supple.  Cardiovascular: Normal rate and regular rhythm.   Pulmonary/Chest: Effort normal and breath sounds normal. No respiratory distress.  Abdominal: Soft. Bowel sounds are normal. There is tenderness in the suprapubic area. There is no rebound, no guarding and no CVA tenderness.  Tenderness is mild.   Genitourinary:    Genitourinary Comments: External genitalia without lesions, scant blood vaginal vault, no CMT, no adnexal tenderness or mass palpated. Uterus without palpable enlargement.   Musculoskeletal: Normal range of motion.  Neurological: She is alert and oriented to person, place, and time. No cranial nerve deficit.  Skin: Skin is warm and dry.  Psychiatric: She has a normal mood and affect. Her behavior is normal.  Nursing note and vitals reviewed.    ED Treatments / Results  Labs (all labs ordered are listed, but only abnormal results are displayed) Labs Reviewed  WET PREP, GENITAL - Abnormal; Notable for the following:       Result Value   Clue Cells Wet Prep HPF POC PRESENT (*)    WBC, Wet Prep HPF POC FEW (*)    All other components within normal limits  COMPREHENSIVE METABOLIC PANEL - Abnormal; Notable for the following:    Potassium 3.3 (*)    ALT 13 (*)    All other components within normal limits  URINALYSIS, ROUTINE W REFLEX MICROSCOPIC (NOT AT [facility]) - Abnormal; Notable for the following:    APPearance CLOUDY (*)    Hgb urine dipstick LARGE (*)    Ketones, ur 15 (*)    Protein, ur 30 (*)    Nitrite POSITIVE (*)    All other components within normal limits  URINE MICROSCOPIC-ADD ON - Abnormal; Notable for the following:    Squamous Epithelial / LPF 6-30 (*)    Bacteria, UA MANY (*)    All other components within normal limits  LIPASE, BLOOD  CBC  POC URINE PREG, ED  GC/CHLAMYDIA PROBE AMP ([place]) NOT AT [facility]   Radiology No results found.  Procedures Procedures (including critical care time)  Medications Ordered in ED Medications - No data to display   Initial Impression / Assessment and Plan / ED Course  I have reviewed the triage vital signs and the nursing notes.  Pertinent lab results that were available during my care of the patient were reviewed by me and considered in my medical decision making (see chart for details).  Clinical Course  Discussed  with the patient clinical and lab findings and plan of care and all questioned fully answered. She will f/u with her PCP. She will return here if any problems arise.   Final Clinical Impressions(s) / ED Diagnoses  20 y.o. female with nausea, vaginal bleeding and suprapubic pain stable for d/c without fever and does not appear toxic. No concern for pyelo or PID at this time.   Final diagnoses:  Vaginal bleeding, abnormal  UTI (lower urinary tract infection)    New Prescriptions New Prescriptions   ONDANSETRON (ZOFRAN) 4 MG TABLET    Take 1 tablet (4 mg total) by mouth every 6 (six) hours.   SULFAMETHOXAZOLE-TRIMETHOPRIM (BACTRIM DS,SEPTRA DS) 800-160 MG TABLET    Take 1 tablet by mouth 2 (two) times daily.     [facility] Orlene Och, NP 04/14/16  4782    Zadie Rhine, MD 04/15/16 0005

## 2016-04-24 ENCOUNTER — Encounter (HOSPITAL_COMMUNITY): Payer: Self-pay

## 2016-04-24 ENCOUNTER — Emergency Department (HOSPITAL_COMMUNITY)
Admission: EM | Admit: 2016-04-24 | Discharge: 2016-04-24 | Disposition: A | Discharge status: Home / Self Care | Payer: Medicaid Other | Attending: Emergency Medicine | Admitting: Emergency Medicine

## 2016-04-24 DIAGNOSIS — L299 Pruritus, unspecified: Secondary | ICD-10-CM | POA: Insufficient documentation

## 2016-04-24 DIAGNOSIS — L282 Other prurigo: Secondary | ICD-10-CM

## 2016-04-24 DIAGNOSIS — F17210 Nicotine dependence, cigarettes, uncomplicated: Secondary | ICD-10-CM | POA: Insufficient documentation

## 2016-04-24 DIAGNOSIS — L989 Disorder of the skin and subcutaneous tissue, unspecified: Secondary | ICD-10-CM | POA: Insufficient documentation

## 2016-04-24 MED ORDER — HYDROXYZINE HCL 25 MG PO TABS: 25.0000 mg | ORAL_TABLET | Freq: Four times a day (QID) | ORAL | 0 refills | Status: DC | PRN

## 2016-04-24 NOTE — ED Triage Notes (Signed)
Patient complains of generalized rash to body x 1 week, no known exposure, no pain. Complains of itching, NAD

## 2016-04-24 NOTE — ED Provider Notes (Signed)
[facility] Provider Note   CSN: [id number] Arrival date & time: 04/24/16  1237   By signing my name below, I, Christy SartoriusAnastasia Kolousek, attest that this documentation has been prepared under the direction and in the presence of  Danelle BerryLeisa Wilmar Prabhakar, PA-C. Electronically Signed: Christy SartoriusAnastasia Kolousek, ED Scribe. 04/24/16. 3:29 PM.   History   Chief Complaint Chief Complaint  Patient presents with  . Rash   The history is provided by the patient. No language interpreter was used.     HPI Comments:  Joanne Aguilar is a 20 y.o. female who presents to the Emergency Department complaining of a generalized pruritic rash across her abdomen and arms.  She states that its red and bumpy.  She began using a different body soap.  No alleviating factors noted.  She denies h/o of allergies.  She also denies SOB and wheezing.  No additional symptoms or complaints noted.   Past Medical History:  Diagnosis Date  . Anemia   . Kidney stones     Patient Active Problem List   Diagnosis Date Noted  . Heavy vaginal bleeding due to contraceptive injection use 07/09/2015  . Surveillance for Depo-Provera contraception 05/13/2015  . NSVD (normal spontaneous vaginal delivery) 05/09/2015    Past Surgical History:  Procedure Laterality Date  . TOOTH EXTRACTION      OB History    Gravida Para Term Preterm AB Living   2 1 1     1    SAB TAB Ectopic Multiple Live Births         0 1       Home Medications    Prior to Admission medications   Medication Sig Start Date End Date Taking? Authorizing Provider  ibuprofen (ADVIL,MOTRIN) 600 MG tablet Take 1 tablet (600 mg total) by mouth every 6 (six) hours as needed. 05/09/15   Marquette SaaAbigail Joseph Lancaster, MD  norethindrone-ethinyl estradiol (JUNEL FE 1/20) 1-20 MG-MCG tablet Take 1 tablet by mouth daily. Patient not taking: Reported on 01/05/2016 07/09/15   Erasmo DownerAngela M Bacigalupo, MD  ondansetron (ZOFRAN) 4 MG tablet Take 1 tablet (4 mg total) by mouth every 6 (six)  hours. 04/14/16   Hope Orlene OchM Neese, NP    Family History No family history on file.  Social History Social History  Substance Use Topics  . Smoking status: Current Some Day Smoker    Types: Cigarettes  . Smokeless tobacco: Never Used  . Alcohol use Yes     Allergies   Review of patient's allergies indicates no known allergies.   Review of Systems Review of Systems  All other systems reviewed and are negative.    Physical Exam Updated Vital Signs BP 113/69   Pulse 100   Temp 98.7 F (37.1 C) (Oral)   Resp 18   Ht 5\' 2"  (1.575 m)   Wt 120 lb (54.4 kg)   LMP 04/17/2016   SpO2 100%   BMI 21.95 kg/m   Physical Exam  Constitutional: She appears well-developed and well-nourished.  HENT:  Head: Normocephalic.  Eyes: Conjunctivae are normal.  Cardiovascular: Normal rate.   Pulmonary/Chest: Effort normal. No respiratory distress.  Abdominal: She exhibits no distension.  Musculoskeletal: Normal range of motion.  Neurological: She is alert.  Skin: Skin is warm and dry.  2, 2mm papules on her right forearm    Psychiatric: She has a normal mood and affect. Her behavior is normal.  Nursing note and vitals reviewed.    ED Treatments / Results   DIAGNOSTIC STUDIES:  Oxygen Saturation is 100% on RA, nml by my interpretation.    COORDINATION OF CARE:  3:27 PM Discussed treatment plan with pt at bedside and pt agreed to plan.  Labs (all labs ordered are listed, but only abnormal results are displayed) Labs Reviewed - No data to display  EKG  EKG Interpretation None       Radiology No results found.  Procedures Procedures (including critical care time)  Medications Ordered in ED Medications - No data to display   Initial Impression / Assessment and Plan / ED Course  I have reviewed the triage vital signs and the nursing notes.  Pertinent labs & imaging results that were available during my care of the patient were reviewed by me and considered in my  medical decision making (see chart for details).  Clinical Course    Patient with nonspecific eruption.  The pt had two small papules on right arm, no edema. No signs of infection. Discharge with benadryl for itching. Follow up with PCP in 2-3 days.   Final Clinical Impressions(s) / ED Diagnoses   Final diagnoses:  Pruritic rash    New Prescriptions New Prescriptions   No medications on file  I personally performed the services described in this documentation, which was scribed in my presence. The recorded information has been reviewed and is accurate.       Danelle BerryLeisa Shaneen Reeser, PA-C 04/26/16 1907    Alvira MondayErin Schlossman, MD 04/28/16 [id number]

## 2016-05-10 ENCOUNTER — Emergency Department (HOSPITAL_COMMUNITY)
Admission: EM | Admit: 2016-05-10 | Discharge: 2016-05-10 | Disposition: A | Discharge status: Home / Self Care | Payer: Medicaid Other | Attending: Emergency Medicine | Admitting: Emergency Medicine

## 2016-05-10 ENCOUNTER — Encounter (HOSPITAL_COMMUNITY): Payer: Self-pay | Admitting: Emergency Medicine

## 2016-05-10 DIAGNOSIS — F17210 Nicotine dependence, cigarettes, uncomplicated: Secondary | ICD-10-CM | POA: Diagnosis not present

## 2016-05-10 DIAGNOSIS — R21 Rash and other nonspecific skin eruption: Secondary | ICD-10-CM | POA: Diagnosis not present

## 2016-05-10 MED ORDER — CLOTRIMAZOLE-BETAMETHASONE 1-0.05 % EX CREA: TOPICAL_CREAM | CUTANEOUS | 0 refills | Status: DC

## 2016-05-10 NOTE — ED Provider Notes (Signed)
[facility]-EMERGENCY DEPT Provider Note   CSN: [id number] Arrival date & time: 05/10/16  1023  By signing my name below, I, Placido Sou, attest that this documentation has been prepared under the direction and in the presence of Arthor Captain, PA-C. Electronically Signed: Placido Sou, ED Scribe. 05/10/16. 11:46 AM.   History   Chief Complaint Chief Complaint  Patient presents with  . Rash    HPI HPI Comments: Joanne Aguilar is a 20 y.o. female who presents to the Emergency Department complaining of constant, moderate, rash to her extremities and torso x 1 month. Pt was seen on 04/24/2016 for similar symptoms and was d/c with vistaril. She believes the rash began on her arms and states her symptoms have persisted since onset. Pt reports associated itchiness across the affected regions. She denies any known recent contacts with a similar rash. Pt denies other associated symptoms at this time.   The history is provided by the patient. No language interpreter was used.    Past Medical History:  Diagnosis Date  . Anemia   . Kidney stones     Patient Active Problem List   Diagnosis Date Noted  . Heavy vaginal bleeding due to contraceptive injection use 07/09/2015  . Surveillance for Depo-Provera contraception 05/13/2015  . NSVD (normal spontaneous vaginal delivery) 05/09/2015    Past Surgical History:  Procedure Laterality Date  . TOOTH EXTRACTION      OB History    Gravida Para Term Preterm AB Living   2 1 1     1    SAB TAB Ectopic Multiple Live Births         0 1       Home Medications    Prior to Admission medications   Medication Sig Start Date End Date Taking? Authorizing Provider  clotrimazole-betamethasone (LOTRISONE) cream Apply to affected area 2 times daily prn 05/10/16   Arthor Captain, PA-C  hydrOXYzine (ATARAX/VISTARIL) 25 MG tablet Take 1 tablet (25 mg total) by mouth every 6 (six) hours as needed for itching. 04/24/16   Danelle Berry, PA-C  ibuprofen  (ADVIL,MOTRIN) 600 MG tablet Take 1 tablet (600 mg total) by mouth every 6 (six) hours as needed. 05/09/15   Marquette Saa, MD  norethindrone-ethinyl estradiol (JUNEL FE 1/20) 1-20 MG-MCG tablet Take 1 tablet by mouth daily. Patient not taking: Reported on 01/05/2016 07/09/15   Erasmo Downer, MD  ondansetron (ZOFRAN) 4 MG tablet Take 1 tablet (4 mg total) by mouth every 6 (six) hours. 04/14/16   Hope Orlene Och, NP    Family History No family history on file.  Social History Social History  Substance Use Topics  . Smoking status: Current Some Day Smoker    Types: Cigarettes  . Smokeless tobacco: Never Used  . Alcohol use Yes     Allergies   Review of patient's allergies indicates no known allergies.   Review of Systems Review of Systems  Constitutional: Negative for chills and fever.  Skin: Positive for color change and rash.   Physical Exam Updated Vital Signs BP 113/57 (BP Location: Right Arm)   Pulse 78   Temp 98.6 F (37 C) (Oral)   Resp 20   Ht 5\' 2"  (1.575 m)   Wt 54.4 kg   LMP 04/17/2016   SpO2 100%   BMI 21.95 kg/m   Physical Exam  Constitutional: She is oriented to person, place, and time. She appears well-developed and well-nourished.  HENT:  Head: Normocephalic and atraumatic.  Eyes: EOM are  normal.  Neck: Normal range of motion.  Cardiovascular: Normal rate.   Pulmonary/Chest: Effort normal. No respiratory distress.  Abdominal: Soft.  Genitourinary: Vaginal discharge:    Musculoskeletal: Normal range of motion.  Neurological: She is alert and oriented to person, place, and time.  Skin: Skin is warm and dry.  Psychiatric: She has a normal mood and affect.  Nursing note and vitals reviewed.  ED Treatments / Results  Labs (all labs ordered are listed, but only abnormal results are displayed) Labs Reviewed - No data to display  EKG  EKG Interpretation None       Radiology No results found.  Procedures Procedures  DIAGNOSTIC  STUDIES: Oxygen Saturation is 99% on RA, normal by my interpretation.    COORDINATION OF CARE: 11:45 AM Discussed next steps with pt. Pt verbalized understanding and is agreeable with the plan.    Medications Ordered in ED Medications - No data to display   Initial Impression / Assessment and Plan / ED Course  I have reviewed the triage vital signs and the nursing notes.  Pertinent labs & imaging results that were available during my care of the patient were reviewed by me and considered in my medical decision making (see chart for details).  Clinical Course    Patient with rash of the upper body and extremities. I suspect that this is pityriasis, however she may have a dermatophyte infection. Patient will be discharged with Lotrisone cream. No signs of current bacterial infections. Discussed follow-up and return precautions with the patient. She appears safe for discharge at this time I personally performed the services described in this documentation, which was scribed in my presence. The recorded information has been reviewed and is accurate.    Final Clinical Impressions(s) / ED Diagnoses   Final diagnoses:  Rash    New Prescriptions Discharge Medication List as of 05/10/2016 12:13 PM    START taking these medications   Details  clotrimazole-betamethasone (LOTRISONE) cream Apply to affected area 2 times daily prn, Print         Arthor Captainbigail Sarann Tregre, PA-C 05/11/16 2319    Cathren LaineKevin Steinl, MD 05/14/16 [phone]

## 2016-05-10 NOTE — ED Triage Notes (Signed)
Seen for same rash last week states is no better rash is itchy

## 2016-07-28 ENCOUNTER — Emergency Department (HOSPITAL_COMMUNITY)
Admission: EM | Admit: 2016-07-28 | Discharge: 2016-07-28 | Disposition: A | Discharge status: Home / Self Care | Payer: Medicaid Other | Attending: Emergency Medicine | Admitting: Emergency Medicine

## 2016-07-28 ENCOUNTER — Encounter (HOSPITAL_COMMUNITY): Payer: Self-pay

## 2016-07-28 DIAGNOSIS — R05 Cough: Secondary | ICD-10-CM | POA: Diagnosis present

## 2016-07-28 DIAGNOSIS — F17210 Nicotine dependence, cigarettes, uncomplicated: Secondary | ICD-10-CM | POA: Diagnosis not present

## 2016-07-28 DIAGNOSIS — R69 Illness, unspecified: Secondary | ICD-10-CM

## 2016-07-28 DIAGNOSIS — J029 Acute pharyngitis, unspecified: Secondary | ICD-10-CM | POA: Insufficient documentation

## 2016-07-28 DIAGNOSIS — J111 Influenza due to unidentified influenza virus with other respiratory manifestations: Secondary | ICD-10-CM

## 2016-07-28 MED ORDER — HYDROCOD POLST-CPM POLST ER 10-8 MG/5ML PO SUER
5.0000 mL | Freq: Once | ORAL | Status: AC
  Administered 2016-07-28: 5 mL via ORAL
  Filled 2016-07-28: qty 5

## 2016-07-28 MED ORDER — BENZONATATE 100 MG PO CAPS: 100.0000 mg | ORAL_CAPSULE | Freq: Three times a day (TID) | ORAL | 0 refills | Status: DC

## 2016-07-28 NOTE — ED Notes (Signed)
Pt understood dc material. NAD noted. Scripts given at dc 

## 2016-07-28 NOTE — ED Triage Notes (Signed)
Pt states that she has been coughing up yellow phlem and blood tinged for three days, vomited x 3 today, denies fevers, c/o chest discomfort when coughing

## 2016-07-28 NOTE — ED Provider Notes (Signed)
[facility]-EMERG[facility]Y DEPT Provider Note   CSN: [id number] Arrival date & time: 07/28/16  1828   By signing my name below, I, Joanne Aguilar, attest that this documentation has been prepared under the direction and in the presence of  Kerrie BuffaloHope Sahith Nurse, NP. Electronically Signed: Clovis PuAvnee Aguilar, ED Scribe. 07/28/16. 8:37 PM.   History   Chief Complaint Chief Complaint  Patient presents with  . Cough   The history is provided by the patient. No language interpreter was used.  Cough  This is a new problem. The current episode started more than 2 days ago. The problem occurs every few minutes. The problem has not changed since onset.The cough is productive of sputum. There has been no fever. Associated symptoms include sore throat. Pertinent negatives include no chest pain, no chills, no ear pain and no shortness of breath. She has tried nothing for the symptoms. She is a smoker.   HPI Comments:  Joanne Aguilar is a 20 y.o. female who presents to the Emergency Department complaining of persistent productive cough with yellow phlegm x several days. She notes associated emesis due to coughing and sore throat. Pt is a smoker and notes she smokes 1 cigarette a day and occasionally smokes marijuana.  No alleviating factors noted. Pt denies abdominal pain, fevers, chills, diarrhea, any other associated symptoms and modifying factors at this time.  No known drug allergies.   Past Medical History:  Diagnosis Date  . Anemia   . Kidney stones     Patient Active Problem List   Diagnosis Date Noted  . Heavy vaginal bleeding due to contraceptive injection use 07/09/2015  . Surveillance for Depo-Provera contraception 05/13/2015  . NSVD (normal spontaneous vaginal delivery) 05/09/2015    Past Surgical History:  Procedure Laterality Date  . TOOTH EXTRACTION      OB History    Gravida Para Term Preterm AB Living   2 1 1     1    SAB TAB Ectopic Multiple Live Births         0 1       Home Medications     Prior to Admission medications   Medication Sig Start Date End Date Taking? Authorizing Provider  benzonatate (TESSALON) 100 MG capsule Take 1 capsule (100 mg total) by mouth every 8 (eight) hours. 07/28/16   Lizbett Garciagarcia Orlene OchM Tequilla Cousineau, NP  clotrimazole-betamethasone (LOTRISONE) cream Apply to affected area 2 times daily prn 05/10/16   Arthor CaptainAbigail Harris, PA-C  hydrOXYzine (ATARAX/VISTARIL) 25 MG tablet Take 1 tablet (25 mg total) by mouth every 6 (six) hours as needed for itching. 04/24/16   Danelle BerryLeisa Tapia, PA-C  ibuprofen (ADVIL,MOTRIN) 600 MG tablet Take 1 tablet (600 mg total) by mouth every 6 (six) hours as needed. 05/09/15   Marquette SaaAbigail Joseph Lancaster, MD  norethindrone-ethinyl estradiol (JUNEL FE 1/20) 1-20 MG-MCG tablet Take 1 tablet by mouth daily. Patient not taking: Reported on 01/05/2016 07/09/15   Erasmo DownerAngela M Bacigalupo, MD  ondansetron (ZOFRAN) 4 MG tablet Take 1 tablet (4 mg total) by mouth every 6 (six) hours. 04/14/16   Kellie Chisolm Orlene OchM Serinity Ware, NP    Family History No family history on file.  Social History Social History  Substance Use Topics  . Smoking status: Current Some Day Smoker    Types: Cigarettes  . Smokeless tobacco: Never Used  . Alcohol use Yes     Allergies   Patient has no known allergies.   Review of Systems Review of Systems  Constitutional: Negative for chills and fever.  HENT:  Positive for sore throat. Negative for ear pain.   Respiratory: Positive for cough. Negative for shortness of breath.   Cardiovascular: Negative for chest pain.  Gastrointestinal: Positive for nausea and vomiting (3 episodes with coughing). Negative for abdominal pain.  Genitourinary: Negative for dysuria and frequency.  Musculoskeletal: Negative for gait problem.  Skin: Negative for rash.  Neurological: Negative for syncope.  Psychiatric/Behavioral: Negative for confusion.   Physical Exam Updated Vital Signs BP 134/93 (BP Location: Left Arm)   Pulse 87   Temp 98.2 F (36.8 C)   Resp 18   Ht 5\' 2"   (1.575 m)   Wt 55.8 kg   SpO2 100%   BMI 22.50 kg/m   Physical Exam  Constitutional: She is oriented to person, place, and time. She appears well-developed and well-nourished. No distress.  HENT:  Head: Normocephalic and atraumatic.  Right Ear: Tympanic membrane normal.  Left Ear: Tympanic membrane normal.  Mouth/Throat: Uvula is midline. Posterior oropharyngeal erythema (mild) present. No posterior oropharyngeal edema.  Eyes: Conjunctivae and EOM are normal. Pupils are equal, round, and reactive to light.  Sclera clear  Neck: Neck supple.  No cervical lymphadenopathy.   Cardiovascular: Normal rate and regular rhythm.   Pulmonary/Chest: Effort normal. She has no wheezes. She has no rales.  Abdominal: Soft. Bowel sounds are normal. There is no tenderness.  Musculoskeletal: Normal range of motion.  Lymphadenopathy:    She has no cervical adenopathy.  Neurological: She is alert and oriented to person, place, and time.  Skin: Skin is warm and dry.  Psychiatric: She has a normal mood and affect. Her behavior is normal.  Nursing note and vitals reviewed.  ED Treatments / Results  DIAGNOSTIC STUDIES:  Oxygen Saturation is 100% on RA, normal by my interpretation.    COORDINATION OF CARE:  8:36 PM Discussed treatment plan with pt at bedside and pt agreed to plan.  Labs (all labs ordered are listed, but only abnormal results are displayed) Labs Reviewed - No data to display   Radiology No results found.  Procedures Procedures (including critical care time)  Medications Ordered in ED Medications  chlorpheniramine-HYDROcodone (TUSSIONEX) 10-8 MG/5ML suspension 5 mL (5 mLs Oral Given 07/28/16 2048)     Initial Impression / Assessment and Plan / ED Course  I have reviewed the triage vital signs and the nursing notes.   Clinical Course     Patient with symptoms consistent with influenza.  Vitals are stable.  No signs of dehydration, tolerating PO's.  Lungs are clear. Due  to patient's presentation and physical exam a chest x-ray was not ordered bc likely diagnosis of flu. Patient will be discharged with instructions to orally hydrate, rest, and use over-the-counter medications such as anti-inflammatories ibuprofen and Aleve for muscle aches. Patient will also be given a cough suppressant.    Final Clinical Impressions(s) / ED Diagnoses   Final diagnoses:  Influenza-like illness    New Prescriptions Discharge Medication List as of 07/28/2016  8:37 PM    START taking these medications   Details  benzonatate (TESSALON) 100 MG capsule Take 1 capsule (100 mg total) by mouth every 8 (eight) hours., Starting Thu 07/28/2016, Print      I personally performed the services described in this documentation, which was scribed in my presence. The recorded information has been reviewed and is accurate.     [address]Brytani Voth CaliforniaM Bernabe Dorce, NP 07/30/16 [id number]    Nira ConnPedro Eduardo Cardama, MD 07/30/16 [id number]

## 2016-10-09 ENCOUNTER — Emergency Department (HOSPITAL_COMMUNITY)
Admission: EM | Admit: 2016-10-09 | Discharge: 2016-10-09 | Disposition: A | Discharge status: Home / Self Care | Payer: Medicaid Other | Attending: Emergency Medicine | Admitting: Emergency Medicine

## 2016-10-09 ENCOUNTER — Encounter (HOSPITAL_COMMUNITY): Payer: Self-pay

## 2016-10-09 DIAGNOSIS — F17219 Nicotine dependence, cigarettes, with unspecified nicotine-induced disorders: Secondary | ICD-10-CM | POA: Insufficient documentation

## 2016-10-09 DIAGNOSIS — N12 Tubulo-interstitial nephritis, not specified as acute or chronic: Secondary | ICD-10-CM | POA: Diagnosis not present

## 2016-10-09 DIAGNOSIS — Z79899 Other long term (current) drug therapy: Secondary | ICD-10-CM | POA: Diagnosis not present

## 2016-10-09 DIAGNOSIS — R109 Unspecified abdominal pain: Secondary | ICD-10-CM | POA: Diagnosis present

## 2016-10-09 LAB — URINALYSIS, ROUTINE W REFLEX MICROSCOPIC
Bilirubin Urine: NEGATIVE
GLUCOSE, UA: NEGATIVE mg/dL
KETONES UR: NEGATIVE mg/dL
NITRITE: NEGATIVE
PROTEIN: 100 mg/dL — AB
Specific Gravity, Urine: 1.011 (ref 1.005–1.030)
pH: 5 (ref 5.0–8.0)

## 2016-10-09 LAB — POC URINE PREG, ED: PREG TEST UR: NEGATIVE

## 2016-10-09 MED ORDER — SODIUM CHLORIDE 0.9 % IV SOLN
INTRAVENOUS | Status: DC
  Administered 2016-10-09: 09:00:00 via INTRAVENOUS

## 2016-10-09 MED ORDER — SODIUM CHLORIDE 0.9 % IV BOLUS (SEPSIS)
1000.0000 mL | Freq: Once | INTRAVENOUS | Status: AC
  Administered 2016-10-09: 1000 mL via INTRAVENOUS

## 2016-10-09 MED ORDER — MORPHINE SULFATE (PF) 4 MG/ML IV SOLN
4.0000 mg | Freq: Once | INTRAVENOUS | Status: AC
  Administered 2016-10-09: 4 mg via INTRAVENOUS
  Filled 2016-10-09: qty 1

## 2016-10-09 MED ORDER — DEXTROSE 5 % IV SOLN
1.0000 g | INTRAVENOUS | Status: DC
  Administered 2016-10-09: 1 g via INTRAVENOUS
  Filled 2016-10-09: qty 10

## 2016-10-09 MED ORDER — ONDANSETRON HCL 4 MG/2ML IJ SOLN
4.0000 mg | Freq: Once | INTRAMUSCULAR | Status: AC
  Administered 2016-10-09: 4 mg via INTRAVENOUS
  Filled 2016-10-09: qty 2

## 2016-10-09 MED ORDER — CEPHALEXIN 500 MG PO CAPS: 500.0000 mg | ORAL_CAPSULE | Freq: Four times a day (QID) | ORAL | 0 refills | Status: DC

## 2016-10-09 MED ORDER — OXYCODONE-ACETAMINOPHEN 5-325 MG PO TABS: 1.0000 | ORAL_TABLET | ORAL | 0 refills | Status: DC | PRN

## 2016-10-09 NOTE — ED Triage Notes (Signed)
Pt states she started having right flank  Pain on Monday; pt c/o n/v and states pain goes into back; Pt c/o pain at 10/10 on arrival. Pt a&ox 4

## 2016-10-09 NOTE — ED Provider Notes (Signed)
[facility]-EMERGENCY DEPT Provider Note   CSN: [id number] Arrival date & time: 10/09/16  0551     History   Chief Complaint Chief Complaint  Patient presents with  . Flank Pain    HPI Joanne Aguilar is a 21 y.o. female.  21 year old female presents with several days of right-sided flank pain with some dysuria. No fever or chills. She's had nausea and vomiting. Denies any vaginal discharge currently on her menstrual cycle. Has had some urinary urgency. Has had a prior history of UTIs. Symptoms have been because of a worse. Flank pain is characterized as sharp and persistent. No treatment use prior to arrival. Nothing makes her symptoms better      Past Medical History:  Diagnosis Date  . Anemia   . Kidney stones     Patient Active Problem List   Diagnosis Date Noted  . Heavy vaginal bleeding due to contraceptive injection use 07/09/2015  . Surveillance for Depo-Provera contraception 05/13/2015  . NSVD (normal spontaneous vaginal delivery) 05/09/2015    Past Surgical History:  Procedure Laterality Date  . TOOTH EXTRACTION      OB History    Gravida Para Term Preterm AB Living   2 1 1     1    SAB TAB Ectopic Multiple Live Births         0 1       Home Medications    Prior to Admission medications   Medication Sig Start Date End Date Taking? Authorizing Provider  benzonatate (TESSALON) 100 MG capsule Take 1 capsule (100 mg total) by mouth every 8 (eight) hours. 07/28/16   Hope Orlene Och, NP  clotrimazole-betamethasone (LOTRISONE) cream Apply to affected area 2 times daily prn 05/10/16   Arthor Captain, PA-C  hydrOXYzine (ATARAX/VISTARIL) 25 MG tablet Take 1 tablet (25 mg total) by mouth every 6 (six) hours as needed for itching. 04/24/16   Danelle Berry, PA-C  ibuprofen (ADVIL,MOTRIN) 600 MG tablet Take 1 tablet (600 mg total) by mouth every 6 (six) hours as needed. 05/09/15   Marquette Saa, MD  norethindrone-ethinyl estradiol (JUNEL FE 1/20) 1-20 MG-MCG tablet  Take 1 tablet by mouth daily. Patient not taking: Reported on 01/05/2016 07/09/15   Erasmo Downer, MD  ondansetron (ZOFRAN) 4 MG tablet Take 1 tablet (4 mg total) by mouth every 6 (six) hours. 04/14/16   Hope Orlene Och, NP    Family History No family history on file.  Social History Social History  Substance Use Topics  . Smoking status: Current Some Day Smoker    Types: Cigarettes  . Smokeless tobacco: Never Used  . Alcohol use Yes     Allergies   Patient has no known allergies.   Review of Systems Review of Systems  All other systems reviewed and are negative.    Physical Exam Updated Vital Signs BP 125/82 (BP Location: Left Arm)   Pulse 90   Temp 98.9 F (37.2 C) (Oral)   Resp 18   LMP 10/03/2016   SpO2 100%   Physical Exam  Constitutional: She is oriented to person, place, and time. She appears well-developed and well-nourished.  Non-toxic appearance. No distress.  HENT:  Head: Normocephalic and atraumatic.  Eyes: Conjunctivae, EOM and lids are normal. Pupils are equal, round, and reactive to light.  Neck: Normal range of motion. Neck supple. No tracheal deviation present. No thyroid mass present.  Cardiovascular: Normal rate, regular rhythm and normal heart sounds.  Exam reveals no gallop.   No murmur  heard. Pulmonary/Chest: Effort normal and breath sounds normal. No stridor. No respiratory distress. She has no decreased breath sounds. She has no wheezes. She has no rhonchi. She has no rales.  Abdominal: Soft. Normal appearance and bowel sounds are normal. She exhibits no distension. There is no tenderness. There is no rebound and no CVA tenderness.  Musculoskeletal: Normal range of motion. She exhibits no edema or tenderness.  Neurological: She is alert and oriented to person, place, and time. She has normal strength. No cranial nerve deficit or sensory deficit. GCS eye subscore is 4. GCS verbal subscore is 5. GCS motor subscore is 6.  Skin: Skin is warm and  dry. No abrasion and no rash noted.  Psychiatric: She has a normal mood and affect. Her speech is normal and behavior is normal.  Nursing note and vitals reviewed.    ED Treatments / Results  Labs (all labs ordered are listed, but only abnormal results are displayed) Labs Reviewed  URINALYSIS, ROUTINE W REFLEX MICROSCOPIC - Abnormal; Notable for the following:       Result Value   APPearance CLOUDY (*)    Hgb urine dipstick SMALL (*)    Protein, ur 100 (*)    Leukocytes, UA LARGE (*)    Bacteria, UA FEW (*)    Squamous Epithelial / LPF 0-5 (*)    All other components within normal limits  POC URINE PREG, ED    EKG  EKG Interpretation None       Radiology No results found.  Procedures Procedures (including critical care time)  Medications Ordered in ED Medications  sodium chloride 0.9 % bolus 1,000 mL (not administered)  0.9 %  sodium chloride infusion (not administered)  cefTRIAXone (ROCEPHIN) 1 g in dextrose 5 % 50 mL IVPB (not administered)  ondansetron (ZOFRAN) injection 4 mg (not administered)  morphine 4 MG/ML injection 4 mg (not administered)     Initial Impression / Assessment and Plan / ED Course  I have reviewed the triage vital signs and the nursing notes.  Pertinent labs & imaging results that were available during my care of the patient were reviewed by me and considered in my medical decision making (see chart for details).     Patient likely pyelonephritis and treated with IV Rocephin here. Feels better and will place on Keflex and return precautions given  Final Clinical Impressions(s) / ED Diagnoses   Final diagnoses:  None    New Prescriptions New Prescriptions   No medications on file     Lorre NickAnthony Naureen Benton, MD 10/09/16 1044

## 2016-12-08 ENCOUNTER — Ambulatory Visit: Payer: Medicaid Other | Admitting: Student in an Organized Health Care Education/Training Program

## 2016-12-08 NOTE — Progress Notes (Deleted)
   CC: ***  HPI: Joanne Aguilar is a 21 y.o. female with PMH significant for *** who presents to [facility] today as a new patient (previously seen at [facility]) to establish care and for birth control. ***  PMH: ***  PSH: ***  OB His: NSVD 2016 ***  MEDS: *** ALLERGIES***  FAMILY HISTORY: ***  SOCIAL: - ***tobacco - ***alcohol - ***lives at home with *** - ***for work   Overdue health maintenance ***  Review of Symptoms:  See HPI for ROS.   CC, SH/smoking status, and VS noted.  Objective: There were no vitals taken for this visit. GEN: NAD, alert, cooperative, and pleasant.*** EYE: no conjunctival injection, pupils equally round and reactive to light ENMT: normal tympanic light reflex, no nasal polyps,no rhinorrhea, no pharyngeal erythema or exudates NECK: full ROM, no thyromegaly RESPIRATORY: clear to auscultation bilaterally with no wheezes, rhonchi or rales, good effort CV: RRR, no m/r/g, no peripheral edema GI: soft, non-tender, non-distended, normoactive bowel sounds, no hepatosplenomegaly SKIN: warm and dry, no rashes or lesions NEURO: II-XII grossly intact, normal gait, peripheral sensation intact PSYCH: AAOx3, appropriate affect  Flu Vaccine: *** Tdap Vaccine: *** - every 32yrs - (<3 lifetime doses or unknown): all wounds -- look up need for Tetanus IG - (>=3 lifetime doses): clean/minor wound if >40yrs from previous; all other wounds if >54yrs from previous Zoster Vaccine: *** (those >50yo, once) Pneumonia Vaccine: *** (those w/ risk factors) - (<48yr) Both: Immunocompromised, cochlear implant, CSF leak, asplenic, sickle cell, Chronic Renal Failure - (<8yr) PPSV-23 only: Heart dz, lung disease, DM, tobacco abuse, alcoholism, cirrhosis/liver disease. - (>46yr): PPSV13 then PPSV23 in 6-12mths;  - (>26yr): repeat PPSV23 once if pt received prior to 21yo and 73yrs have passed  Assessment and plan:  No problem-specific Assessment & Plan notes found for this  encounter.   No orders of the defined types were placed in this encounter.   No orders of the defined types were placed in this encounter.    Howard Pouch, MD,MS,  PGY1 12/08/2016 1:32 PM

## 2016-12-20 ENCOUNTER — Other Ambulatory Visit (HOSPITAL_COMMUNITY)
Admission: RE | Admit: 2016-12-20 | Discharge: 2016-12-20 | Disposition: A | Discharge status: Home / Self Care | Payer: Medicaid Other | Source: Ambulatory Visit | Attending: Family Medicine | Admitting: Family Medicine

## 2016-12-20 ENCOUNTER — Encounter: Payer: Self-pay | Admitting: Student

## 2016-12-20 ENCOUNTER — Ambulatory Visit (INDEPENDENT_AMBULATORY_CARE_PROVIDER_SITE_OTHER): Payer: Medicaid Other | Admitting: Student

## 2016-12-20 ENCOUNTER — Ambulatory Visit: Payer: Medicaid Other | Admitting: Student in an Organized Health Care Education/Training Program

## 2016-12-20 VITALS — BP 100/66 | HR 88 | Temp 98.2°F | Wt 117.0 lb

## 2016-12-20 DIAGNOSIS — Z202 Contact with and (suspected) exposure to infections with a predominantly sexual mode of transmission: Secondary | ICD-10-CM | POA: Insufficient documentation

## 2016-12-20 DIAGNOSIS — Z113 Encounter for screening for infections with a predominantly sexual mode of transmission: Secondary | ICD-10-CM | POA: Insufficient documentation

## 2016-12-20 DIAGNOSIS — F32A Depression, unspecified: Secondary | ICD-10-CM | POA: Insufficient documentation

## 2016-12-20 DIAGNOSIS — N898 Other specified noninflammatory disorders of vagina: Secondary | ICD-10-CM | POA: Insufficient documentation

## 2016-12-20 DIAGNOSIS — F329 Major depressive disorder, single episode, unspecified: Secondary | ICD-10-CM | POA: Insufficient documentation

## 2016-12-20 DIAGNOSIS — IMO0001 Reserved for inherently not codable concepts without codable children: Secondary | ICD-10-CM | POA: Insufficient documentation

## 2016-12-20 LAB — POCT WET PREP (WET MOUNT)
Clue Cells Wet Prep Whiff POC: POSITIVE
Trichomonas Wet Prep HPF POC: ABSENT

## 2016-12-20 MED ORDER — SERTRALINE HCL 25 MG PO TABS: 25.0000 mg | ORAL_TABLET | Freq: Every day | ORAL | 2 refills | Status: DC

## 2016-12-20 MED ORDER — METRONIDAZOLE 500 MG PO TABS: 500.0000 mg | ORAL_TABLET | Freq: Two times a day (BID) | ORAL | 0 refills | Status: DC

## 2016-12-20 NOTE — Addendum Note (Signed)
Addended by: Velora Heckler A on: 12/20/2016 04:02 PM   Modules accepted: Orders

## 2016-12-20 NOTE — Assessment & Plan Note (Signed)
PHQ 9- 14, GAD7- 11, no current red flag symptoms - will start zoloft and follow in 2 weeks - patient to see [facility]

## 2016-12-20 NOTE — Assessment & Plan Note (Addendum)
Possible STD exposure given sexually active, HIV/RPR, GC/CT collected

## 2016-12-20 NOTE — Progress Notes (Signed)
   Subjective:    Patient ID: Joanne Aguilar, female    DOB: 09-24-1995, 21 y.o.   MRN: [id number]   CC: establish care  HPI: 21 y/o F presents to establish care She reports vaginal discharge which has been present since her nexplanon was placed in 01/2016, no vaginal irritation, dysuria, abdominal pain or fever  Low mood - reports occasional low mood since her nexplanon was placed in 01/2016 - she reports having thoughts of self harm when she first started feeling down but never hurt herself and now denies current SI/HI or thoughts of self harm - no gun in the home   Past Medical History:  Diagnosis Date  . Anemia   . Kidney stones    Past Surgical History:  Procedure Laterality Date  . TOOTH EXTRACTION     Social History   Social History  . Marital status: Single    Spouse name: N/A  . Number of children: N/A  . Years of education: N/A   Occupational History  . Not on file.   Social History Main Topics  . Smoking status: Former Smoker    Types: Cigarettes  . Smokeless tobacco: Never Used  . Alcohol use Yes     Comment: socially  . Drug use: Yes    Types: Marijuana     Comment: None since before pregnancy  . Sexual activity: Yes    Birth control/ protection: Implant   Other Topics Concern  . Not on file   Social History Narrative  . No narrative on file    Family History  Problem Relation Age of Onset  . Hypertension Maternal Grandmother      OB Hx  G1P1001 term vaginal delivery  Smoking status reviewed  Review of Systems  Per HPI, else denies recent illness, chest pain, shortness of breath,     Objective:  BP 100/66   Pulse 88   Temp 98.2 F (36.8 C) (Oral)   Wt 117 lb (53.1 kg)   LMP  (LMP Unknown)   SpO2 98%   BMI 21.40 kg/m  Vitals and nursing note reviewed  General: NAD Cardiac: RRR, Respiratory: CTAB, normal effort Skin: warm and dry, no rashes noted Extremities: nexplanon palpated in left upper arm Neuro: alert and  oriented, no focal deficits Psych: appropriate affect Pelvic: Normal EGBUS, normal vaginal canal, normal cervix with no CMT, normal mobile uterus, normal adnexa with no masses, no adnexal tenderness     Assessment & Plan:    Depression PHQ 9- 14, GAD7- 11, no current red flag symptoms - will start zoloft and follow in 2 weeks - patient to see [facility]  Vaginal discharge Wet prep showed BV, will treat with flagyl - GC/CT collected will follow - HIV/RPR also collected  STD exposure Possible STD exposure given sexually active, HIV/RPR, GC/CT collected    Lindyn Vossler A. Kennon Rounds MD, MS Family Medicine Resident PGY-3 Pager [phone]

## 2016-12-20 NOTE — Patient Instructions (Addendum)
Follow up with Dr Mosetta Putt in 2 weeks for depression Follow with Behavioral health You had lab testing today and will be called about any abnormal results

## 2016-12-20 NOTE — Assessment & Plan Note (Addendum)
Wet prep showed BV, will treat with flagyl - GC/CT collected will follow - HIV/RPR also collected

## 2016-12-21 ENCOUNTER — Telehealth: Payer: Self-pay | Admitting: Student

## 2016-12-21 ENCOUNTER — Ambulatory Visit: Payer: Medicaid Other

## 2016-12-21 LAB — HIV ANTIBODY (ROUTINE TESTING W REFLEX): HIV Screen 4th Generation wRfx: NONREACTIVE

## 2016-12-21 LAB — CERVICOVAGINAL ANCILLARY ONLY
CHLAMYDIA, DNA PROBE: POSITIVE — AB
NEISSERIA GONORRHEA: NEGATIVE

## 2016-12-21 LAB — RPR: RPR Ser Ql: NONREACTIVE

## 2016-12-21 NOTE — Telephone Encounter (Signed)
Patient + for Chlamydia,. She was called and informed and she will come to the office for treatment, Please give both azithromycin and rocephin for both gonorrhea and chlamydia when she arrives

## 2016-12-21 NOTE — Progress Notes (Signed)
Please call patient and tell her that that her HIV and syphilis testing were negative

## 2016-12-22 ENCOUNTER — Telehealth: Payer: Self-pay

## 2016-12-22 ENCOUNTER — Ambulatory Visit (INDEPENDENT_AMBULATORY_CARE_PROVIDER_SITE_OTHER): Payer: Medicaid Other | Admitting: *Deleted

## 2016-12-22 DIAGNOSIS — Z202 Contact with and (suspected) exposure to infections with a predominantly sexual mode of transmission: Secondary | ICD-10-CM

## 2016-12-22 MED ORDER — AZITHROMYCIN 500 MG PO TABS
1000.0000 mg | ORAL_TABLET | Freq: Once | ORAL | Status: AC
  Administered 2016-12-22: 1000 mg via ORAL

## 2016-12-22 MED ORDER — CEFTRIAXONE SODIUM 250 MG IJ SOLR
250.0000 mg | Freq: Once | INTRAMUSCULAR | Status: AC
Start: 2016-12-22 — End: 2016-12-22
  Administered 2016-12-22: 250 mg via INTRAMUSCULAR

## 2016-12-22 NOTE — Telephone Encounter (Signed)
Pt contacted and scheduled for a nurse visit this afternoon for std treatment.

## 2016-12-22 NOTE — Telephone Encounter (Signed)
Pt informed of negative rpr and hiv.

## 2016-12-22 NOTE — Telephone Encounter (Signed)
-----   Message from Bonney Aid, MD sent at 12/21/2016  9:40 AM EDT ----- Please call patient and tell her that that her HIV and syphilis testing were negative

## 2016-12-23 NOTE — Progress Notes (Signed)
   Patient in nurse clinic today for STD treatment of Chlymdia treatment.  Patient advised to abstain from sex for 7-10 days after treatment or when partner has been tested/treated.  Azithromycin 1 GM PO x 1 given and Ceftriaxone 250 mg IM x 1 given in RUOQ per Dr. Elie Confer orders (Dr. Lum Babe verified dosage verbally).  Pt was observed for 15 minutes after injection and showed no signs of reaction, was released at that time.  Patient to follow up in 2-3 months for re-screening.  Advised to use condoms with all sexual activity.  STD report form fax completed and faxed to [facility] Department at [phone] (STD department).  Patient verbalized understanding.  Fleeger, Maryjo Rochester, CMA

## 2017-01-11 ENCOUNTER — Ambulatory Visit (INDEPENDENT_AMBULATORY_CARE_PROVIDER_SITE_OTHER): Payer: Medicaid Other | Admitting: Student in an Organized Health Care Education/Training Program

## 2017-01-11 ENCOUNTER — Encounter: Payer: Self-pay | Admitting: Student in an Organized Health Care Education/Training Program

## 2017-01-11 DIAGNOSIS — Z3046 Encounter for surveillance of implantable subdermal contraceptive: Secondary | ICD-10-CM

## 2017-01-11 DIAGNOSIS — F329 Major depressive disorder, single episode, unspecified: Secondary | ICD-10-CM

## 2017-01-11 DIAGNOSIS — F32A Depression, unspecified: Secondary | ICD-10-CM

## 2017-01-11 DIAGNOSIS — Z3040 Encounter for surveillance of contraceptives, unspecified: Secondary | ICD-10-CM

## 2017-01-11 NOTE — Patient Instructions (Addendum)
Please follow up in 2-4 weeks so we can check on your depression.   When you're going through tough times, it's easy to feel lonely and overwhelmed. Remember that YOU ARE NOT ALONE and we at [facility] want to support you during this difficult time.  There are many ways to get help and support when you are ready.  You can call the following help lines: - National suicide hotline ((403)382-2295) - National Hopeline Network (1-800-SUICIDE)   You can schedule an appointment with a team member at [facility] - your primary care doc or one of our counselors.  We are all here to support you. A doctor is on call 24/7, so call us if you need to [phone]).   There are many treatments that can help people during difficult times, and we can talk with you about medications, counseling, diet, and other options. We want to offer you HOPE that it won't always feel this bad.   If you are seriously thinking about hurting yourself or have a plan, please call 911 or go to any emergency room right away for immediate help.    10 LITTLE Things To Do When You're Feeling Too Down To Do Anything  Take a shower. Even if you plan to stay in all day long and not see a soul, take a shower. It takes the most effort to hop in to the shower but once you do, you'll feel immediate results. It will wake you up and you'll be feeling much fresher (and cleaner too).  Brush and floss your teeth. Give your teeth a good brushing with a floss finish. It's a small task but it feels so good and you can check 'taking care of your health' off the list of things to do.  Do something small on your list. Most of [place] have some small thing we would like to get done (load of laundry, sew a button, email a friend). Doing one of these things will make you feel like you've accomplished something.  Drink water. Drinking water is easy right? It's also really beneficial for your health so keep a glass beside you  all day and take sips often. It gives you energy and prevents you from boredom eating.  Do some floor exercises. The last thing you want to do is exercise but it might be just the thing you need the most. Keep it simple and do exercises that involve sitting or laying on the floor. Even the smallest of exercises release chemicals in the brain that make you feel good. Yoga stretches or core exercises are going to make you feel good with minimal effort.  Make your bed. Making your bed takes a few minutes but it's productive and you'll feel relieved when it's done. An unmade bed is a huge visual reminder that you're having an unproductive day. Do it and consider it your housework for the day.  Put on some nice clothes. Take the sweatpants off even if you don't plan to go anywhere. Put on clothes that make you feel good. Take a look in the mirror so your brain recognizes the sweatpants have been replaced with clothes that make you look great. It's an instant confidence booster.  Wash the dishes. A pile of dirty dishes in the sink is a reflection of your mood. It's possible that if you wash up the dishes, your mood will follow suit. It's worth a try.  Cook a real meal. If you have the luxury to  have a "do nothing" day, you have time to make a real meal for yourself. Make a meal that you love to eat. The process is good to get you out of the funk and the food will ensure you have more energy for tomorrow.  Write out your thoughts by hand. When you hand write, you stimulate your brain to focus on the moment that you're in so make yourself comfortable and write whatever comes into your mind. Put those thoughts out on paper so they stop spinning around in your head. Those thoughts might be the very thing holding you down.

## 2017-01-11 NOTE — Progress Notes (Signed)
   CC: Depression, wants nexplanon out  HPI: Joanne Aguilar is a 21 y.o. female with PMH significant for STD, Depression who [facility] today for depression follow up. She was previously seen by Dr. Kennon RoundsHaney two weeks ago. She was found to be chlamydia positive at that time and treated in our office as a nurse visit.   Previously PHQ-9 was 14. PHQ-9 today is 11.  Anhedonia:   1 Mood:  1 Sleep:  1 Energy: 1 Appetite: 2 Worthless: 2 Concentrate: 0 Psychomotor: 2 SI:  1  Patient endorses passive thoughts that she does not want to be here, never active thoughts, no plan, no thoughts of hurting herself or others. She cites her daughter as a protective factor/her reason for living.   Patient feels depression is 2/2 nexplanon. She was prescribed Zoloft at her last visit but did not fill the prescription. She does not want to take meds. She refuses seeing behavioral health in the office today. She would like her nexplanon removed. She does not want any replacement options (Discussed other forms of contraception) states she will use condoms.  Understands risks of having no back up contraception.  Review of Symptoms:  See HPI for ROS.   CC, SH/smoking status, and VS noted.  Objective: BP 102/60   Pulse 86   Temp 98.8 F (37.1 C) (Oral)   Ht 5\' 2"  (1.575 m)   Wt 53.1 kg (117 lb)   LMP  (LMP Unknown)   SpO2 95%   BMI 21.40 kg/m  GEN: NAD, alert, cooperative, and pleasant. RESPIRATORY: clear to auscultation bilaterally with no wheezes, rhonchi or rales, good effort CV: RRR, no m/r/g, no peripheral edema GI: soft, non-tender, non-distended, normoactive bowel sounds, no hepatosplenomegaly NEURO: II-XII grossly intact, normal gait, peripheral sensation intact PSYCH: AAOx3, appropriate affect  Nexplanon Removal Procedure note Patient given informed consent for removal of her Nexplanon.  Signed copy in the chart.  Time out recorded.  Implanon site identified and able to be palpated.    Area prepped in usual sterile fashon.  One cc of 1% lidocaine was used to anesthetize the area at the distal end of the implant. A small stab incision was made near implant on the distal portion.   The implanon rod was grasped using hemostats and removed without difficulty.   Rod measured at 4cm to ensure complete removal.   Steri-strips were applied over the small incision.   A pressure bandage was applied to reduce any bruising.   There was less than 3 cc blood loss. There were no complications.  The patient tolerated the procedure well and was given post procedure instructions.  Procedure completed under guidance of attending Dr. Randolm IdolFletke  Assessment and plan:  Depression - refuses meds - wanted nexplanon out, this was completed - not in acute danger to self or others - resources including suicide hotline and behavioral health info provided - follow up in 2-4 weeks for depression surveillance  Nexplanon removal - procedure note above - patient prefers barrier method - continue to recommend backup contraception at future visits   Howard PouchLauren Doralyn Kirkes, MD,MS,  PGY1 01/12/2017 2:07 PM

## 2017-01-12 DIAGNOSIS — Z3046 Encounter for surveillance of implantable subdermal contraceptive: Secondary | ICD-10-CM | POA: Insufficient documentation

## 2017-01-12 NOTE — Assessment & Plan Note (Signed)
-   procedure note above - patient prefers barrier method - continue to recommend backup contraception at future visits

## 2017-01-12 NOTE — Assessment & Plan Note (Addendum)
-   refuses meds - wanted nexplanon out, this was completed - not in acute danger to self or others - resources including suicide hotline and behavioral health info provided - follow up in 2-4 weeks for depression surveillance

## 2017-01-23 ENCOUNTER — Ambulatory Visit (INDEPENDENT_AMBULATORY_CARE_PROVIDER_SITE_OTHER): Payer: Medicaid Other | Admitting: Internal Medicine

## 2017-01-23 ENCOUNTER — Encounter: Payer: Self-pay | Admitting: Internal Medicine

## 2017-01-23 VITALS — BP 116/72 | HR 77 | Temp 98.4°F | Ht 62.0 in | Wt 117.6 lb

## 2017-01-23 DIAGNOSIS — Z3202 Encounter for pregnancy test, result negative: Secondary | ICD-10-CM | POA: Diagnosis not present

## 2017-01-23 DIAGNOSIS — N920 Excessive and frequent menstruation with regular cycle: Secondary | ICD-10-CM

## 2017-01-23 DIAGNOSIS — N923 Ovulation bleeding: Secondary | ICD-10-CM

## 2017-01-23 LAB — POCT URINE PREGNANCY: Preg Test, Ur: NEGATIVE

## 2017-01-23 NOTE — Progress Notes (Signed)
   Subjective:    Joanne Aguilar - 21 y.o. female MRN [id number]  Date of birth: 09/21/1995  HPI  Joanne Aguilar is here for vaginal spotting. Had very small amount of spotting yesterday (blood on toilet paper) with minor menstrual cramps. Has since resolved. LMP 5/12-5/16. Nexplanon removed 5/9. Previously had periods every other month with Nexplanon. No dysuria, urinary frequency, or urgency. No vaginal discharge. No blood in stools. Patient is sexually active. Has not used condoms consistently since Nexplanon removal.   -  reports that she has quit smoking. Her smoking use included Cigarettes. She has never used smokeless tobacco. - Review of Systems: Per HPI. - Past Medical History: Patient Active Problem List   Diagnosis Date Noted  . Nexplanon removal 01/12/2017  . Depression 12/20/2016  . STD exposure 12/20/2016  . NSVD (normal spontaneous vaginal delivery) 05/09/2015   - Medications: reviewed and updated   Objective:   Physical Exam BP 116/72   Pulse 77   Temp 98.4 F (36.9 C) (Oral)   Ht 5\' 2"  (1.575 m)   Wt 117 lb 9.6 oz (53.3 kg)   LMP 01/14/2017 (Exact Date)   SpO2 99%   Breastfeeding? No   BMI 21.51 kg/m  Gen: NAD, alert, cooperative with exam, well-appearing Abd: SNTND, BS present, no guarding or organomegaly  Assessment & Plan:   Spotting Between Menses:  Patient with one day of mild spotting after menstrual cycle in setting of recent removal of Nexplanon. Discussed with patient that this may be related to regulation of hormone levels given recent removal of progesterone containing device. Patient without any infectious signs/symptoms concerning for PID or vaginitis. Upreg was negative. Discussed other contraceptive options and patient only interested in condoms for pregnancy prevention at present. Gave patient a supply of condoms at office visit today. Instructed patient to follow up with PCP if this reoccurs.    Marcy Sirenatherine Fynn Vanblarcom, D.O. 01/23/2017, 3:05  PM PGY-2, [facility]

## 2017-01-23 NOTE — Patient Instructions (Signed)
Your urine pregnancy test is negative. Please follow up with your PCP if you continue to have problems with irregular menstrual bleeding.

## 2017-01-31 ENCOUNTER — Encounter (HOSPITAL_COMMUNITY): Payer: Self-pay | Admitting: Emergency Medicine

## 2017-01-31 DIAGNOSIS — N644 Mastodynia: Secondary | ICD-10-CM | POA: Diagnosis not present

## 2017-01-31 DIAGNOSIS — Z79899 Other long term (current) drug therapy: Secondary | ICD-10-CM | POA: Diagnosis not present

## 2017-01-31 DIAGNOSIS — Z87891 Personal history of nicotine dependence: Secondary | ICD-10-CM | POA: Insufficient documentation

## 2017-01-31 LAB — COMPREHENSIVE METABOLIC PANEL
ALT: 12 U/L — AB (ref 14–54)
ANION GAP: 8 (ref 5–15)
AST: 20 U/L (ref 15–41)
Albumin: 4.1 g/dL (ref 3.5–5.0)
Alkaline Phosphatase: 59 U/L (ref 38–126)
BUN: 6 mg/dL (ref 6–20)
CO2: 20 mmol/L — AB (ref 22–32)
CREATININE: 0.62 mg/dL (ref 0.44–1.00)
Calcium: 9.1 mg/dL (ref 8.9–10.3)
Chloride: 109 mmol/L (ref 101–111)
GFR calc non Af Amer: >60 mL/min (ref >60)
Glucose, Bld: 136 mg/dL — ABNORMAL HIGH (ref 65–99)
Potassium: 3.1 mmol/L — ABNORMAL LOW (ref 3.5–5.1)
SODIUM: 137 mmol/L (ref 135–145)
Total Bilirubin: 0.6 mg/dL (ref 0.3–1.2)
Total Protein: 6.5 g/dL (ref 6.5–8.1)

## 2017-01-31 LAB — URINALYSIS, ROUTINE W REFLEX MICROSCOPIC
Bilirubin Urine: NEGATIVE
Glucose, UA: NEGATIVE mg/dL
Hgb urine dipstick: NEGATIVE
KETONES UR: NEGATIVE mg/dL
Nitrite: NEGATIVE
PROTEIN: 30 mg/dL — AB
Specific Gravity, Urine: 1.024 (ref 1.005–1.030)
pH: 5 (ref 5.0–8.0)

## 2017-01-31 LAB — CBC
HCT: 38.8 % (ref 36.0–46.0)
HEMOGLOBIN: 12.8 g/dL (ref 12.0–15.0)
MCH: 29.2 pg (ref 26.0–34.0)
MCHC: 33 g/dL (ref 30.0–36.0)
MCV: 88.4 fL (ref 78.0–100.0)
Platelets: 116 10*3/uL — ABNORMAL LOW (ref 150–400)
RBC: 4.39 MIL/uL (ref 3.87–5.11)
RDW: 14.3 % (ref 11.5–15.5)
WBC: 4.2 10*3/uL (ref 4.0–10.5)

## 2017-01-31 LAB — POC URINE PREG, ED: Preg Test, Ur: NEGATIVE

## 2017-01-31 LAB — LIPASE, BLOOD: LIPASE: 26 U/L (ref 11–51)

## 2017-01-31 MED ORDER — ONDANSETRON 4 MG PO TBDP
ORAL_TABLET | ORAL | Status: AC
  Filled 2017-01-31: qty 1

## 2017-01-31 MED ORDER — ONDANSETRON 4 MG PO TBDP
4.0000 mg | ORAL_TABLET | Freq: Once | ORAL | Status: AC | PRN
  Administered 2017-01-31: 4 mg via ORAL

## 2017-01-31 NOTE — ED Triage Notes (Signed)
Pt presents to ER with bilat breast tenderness, lower back pain, and abd cramping x 3-4 days; pt states she was on her menstrual cycle 5/12-5/17 and this is unusual for her; states she recently came off nexplanon

## 2017-02-01 ENCOUNTER — Emergency Department (HOSPITAL_COMMUNITY)
Admission: EM | Admit: 2017-02-01 | Discharge: 2017-02-01 | Disposition: A | Discharge status: Home / Self Care | Payer: Medicaid Other | Attending: Emergency Medicine | Admitting: Emergency Medicine

## 2017-02-01 DIAGNOSIS — N644 Mastodynia: Secondary | ICD-10-CM

## 2017-02-01 MED ORDER — CEPHALEXIN 500 MG PO CAPS
500.0000 mg | ORAL_CAPSULE | Freq: Four times a day (QID) | ORAL | 0 refills | Status: DC
Start: 2017-02-01 — End: 2017-03-03

## 2017-02-01 NOTE — Discharge Instructions (Signed)
You are not pregnant. Follow up with your primary care doctor. Return to the ED if you develop new or worsening symptoms.

## 2017-02-01 NOTE — ED Provider Notes (Signed)
[facility]-EMERGENCY DEPT Provider Note   CSN: [id number] Arrival date & time: 01/31/17  2053  By signing my name below, I, Linna Darner, attest that this documentation has been prepared under the direction and in the presence of physician practitioner, Haruko Mersch, Jeannett Senior, MD. Electronically Signed: Linna Darner, Scribe. 02/01/2017. 1:48 AM.  History   Chief Complaint Chief Complaint  Patient presents with  . Breast Pain    bilat  . Back Pain  . Vaginal Bleeding   The history is provided by the patient. No language interpreter was used.    HPI Comments: Joanne Aguilar is a 21 y.o. female who presents to the Emergency Department complaining of constant, gradually worsening bilateral breast pain for four days. Patient reports some associated nausea without vomiting. She notes she had some blood in her urine on 5/26 without any other episodes since that time. She has tried ibuprofen without improvement of her breast pain. No h/o the same. Patient had her Nexplanon implant removed on 5/9 because it was causing her to become depressed and has not used hormone therapy since. Her LMP was on 5/9. She doubts the possibility of pregnancy. She has been eating, drinking, urinating, and defecating normally. No h/o DM. No regular medications. She denies discharge/bleeding from her nipples, breast lesions/redness/bruising, dyspnea, cough, back pain, fevers, chills, dysuria, abdominal pain, abnormal vaginal bleeding/discharge, or any other associated symptoms.  Past Medical History:  Diagnosis Date  . Anemia   . Kidney stones     Patient Active Problem List   Diagnosis Date Noted  . Nexplanon removal 01/12/2017  . Depression 12/20/2016  . STD exposure 12/20/2016  . NSVD (normal spontaneous vaginal delivery) 05/09/2015    Past Surgical History:  Procedure Laterality Date  . TOOTH EXTRACTION      OB History    Gravida Para Term Preterm AB Living   2 1 1     1    SAB TAB Ectopic Multiple Live  Births         0 1       Home Medications    Prior to Admission medications   Not on File    Family History Family History  Problem Relation Age of Onset  . Hypertension Maternal Grandmother     Social History Social History  Substance Use Topics  . Smoking status: Former Smoker    Types: Cigarettes  . Smokeless tobacco: Never Used  . Alcohol use Yes     Comment: socially     Allergies   Patient has no known allergies.   Review of Systems Review of Systems All other systems reviewed and are negative for acute change except as noted in the HPI. Physical Exam Updated Vital Signs BP 125/75   Pulse 92   Temp 98.4 F (36.9 C) (Oral)   Resp 18   LMP 01/14/2017 (Exact Date)   SpO2 100%   Physical Exam  Constitutional: She is oriented to person, place, and time. She appears well-developed and well-nourished. No distress.  HENT:  Head: Normocephalic and atraumatic.  Mouth/Throat: Oropharynx is clear and moist. No oropharyngeal exudate.  Eyes: Conjunctivae and EOM are normal. Pupils are equal, round, and reactive to light.  Neck: Normal range of motion. Neck supple.  No meningismus.  Cardiovascular: Normal rate, regular rhythm, normal heart sounds and intact distal pulses.   No murmur heard. Pulmonary/Chest: Effort normal and breath sounds normal. No respiratory distress.  Breast exam: Chaperone present. Breasts appear normal to inspection. Mild tenderness on the lateral side  of each breast. No erythema. No fluctuance. No nipple discharge. Breasts are fibrocystic.   Abdominal: Soft. There is no tenderness. There is no rebound, no guarding and no CVA tenderness.  Musculoskeletal: Normal range of motion. She exhibits no edema or tenderness.  Neurological: She is alert and oriented to person, place, and time. No cranial nerve deficit. She exhibits normal muscle tone. Coordination normal.  No ataxia on finger to nose bilaterally. No pronator drift. 5/5 strength  throughout. CN 2-12 intact.Equal grip strength. Sensation intact.   Skin: Skin is warm.  Psychiatric: Her behavior is normal.  Flat affect.  Nursing note and vitals reviewed.  ED Treatments / Results  Labs (all labs ordered are listed, but only abnormal results are displayed) Labs Reviewed  COMPREHENSIVE METABOLIC PANEL - Abnormal; Notable for the following:       Result Value   Potassium 3.1 (*)    CO2 20 (*)    Glucose, Bld 136 (*)    ALT 12 (*)    All other components within normal limits  CBC - Abnormal; Notable for the following:    Platelets 116 (*)    All other components within normal limits  URINALYSIS, ROUTINE W REFLEX MICROSCOPIC - Abnormal; Notable for the following:    APPearance HAZY (*)    Protein, ur 30 (*)    Leukocytes, UA LARGE (*)    Bacteria, UA RARE (*)    Squamous Epithelial / LPF 6-30 (*)    All other components within normal limits  URINE CULTURE  LIPASE, BLOOD  POC URINE PREG, ED    EKG  EKG Interpretation None       Radiology No results found.  Procedures Procedures (including critical care time)  DIAGNOSTIC STUDIES: Oxygen Saturation is 100% on RA, normal by my interpretation.    COORDINATION OF CARE: 1:43 AM Discussed treatment plan with pt at bedside and pt agreed to plan.  Medications Ordered in ED Medications  ondansetron (ZOFRAN-ODT) 4 MG disintegrating tablet (not administered)  ondansetron (ZOFRAN-ODT) disintegrating tablet 4 mg (4 mg Oral Given 01/31/17 2104)     Initial Impression / Assessment and Plan / ED Course  I have reviewed the triage vital signs and the nursing notes.  Pertinent labs & imaging results that were available during my care of the patient were reviewed by me and considered in my medical decision making (see chart for details).     Patient with 4 days of bilateral breast pain after having her nexplanon Removed on May 9. Has had intermittent abdominal cramping but denies this on exam today. Denies  any urinary symptoms.  HCG negative.    Breasts appear normal on exam without evidence of cellulitis or abscess. Suspect tenderness to the hormone imbalance due to recently stopping birth control. We'll treat for UTI.  Follow up with her GYN. Return precautions discussed.   Final Clinical Impressions(s) / ED Diagnoses   Final diagnoses:  Breast pain    New Prescriptions New Prescriptions   No medications on file  I personally performed the services described in this documentation, which was scribed in my presence. The recorded information has been reviewed and is accurate.    Glynn Octaveancour, Varnell Orvis, MD 02/01/17 [phone]

## 2017-02-02 LAB — URINE CULTURE

## 2017-03-02 ENCOUNTER — Encounter (HOSPITAL_COMMUNITY): Payer: Self-pay | Admitting: Emergency Medicine

## 2017-03-02 DIAGNOSIS — Z5321 Procedure and treatment not carried out due to patient leaving prior to being seen by health care provider: Secondary | ICD-10-CM | POA: Insufficient documentation

## 2017-03-02 DIAGNOSIS — R109 Unspecified abdominal pain: Secondary | ICD-10-CM | POA: Insufficient documentation

## 2017-03-02 LAB — URINALYSIS, ROUTINE W REFLEX MICROSCOPIC
BILIRUBIN URINE: NEGATIVE
Glucose, UA: NEGATIVE mg/dL
Hgb urine dipstick: NEGATIVE
Ketones, ur: NEGATIVE mg/dL
Nitrite: NEGATIVE
Protein, ur: NEGATIVE mg/dL
SPECIFIC GRAVITY, URINE: 1.018 (ref 1.005–1.030)
pH: 5 (ref 5.0–8.0)

## 2017-03-02 LAB — CBC
HEMATOCRIT: 39.7 % (ref 36.0–46.0)
Hemoglobin: 13.1 g/dL (ref 12.0–15.0)
MCH: 29 pg (ref 26.0–34.0)
MCHC: 33 g/dL (ref 30.0–36.0)
MCV: 88 fL (ref 78.0–100.0)
Platelets: 111 10*3/uL — ABNORMAL LOW (ref 150–400)
RBC: 4.51 MIL/uL (ref 3.87–5.11)
RDW: 14 % (ref 11.5–15.5)
WBC: 6.8 10*3/uL (ref 4.0–10.5)

## 2017-03-02 LAB — I-STAT BETA HCG BLOOD, ED (MC, WL, AP ONLY)

## 2017-03-02 NOTE — ED Triage Notes (Signed)
Pt presents to ED for assessment of centralized abdominal pain, sharp in nature, flaring up earlier for approx 5 minutes, with returning of the pain after a nap this evening.  Pt states pain is just crampy right now, not the same.  Pt endorses diarrhea after taking a laxative yesterday.  Pt denies any other symptoms except "tender breasts:"

## 2017-03-03 ENCOUNTER — Ambulatory Visit (INDEPENDENT_AMBULATORY_CARE_PROVIDER_SITE_OTHER): Payer: Medicaid Other | Admitting: Family Medicine

## 2017-03-03 ENCOUNTER — Encounter: Payer: Self-pay | Admitting: Family Medicine

## 2017-03-03 ENCOUNTER — Other Ambulatory Visit (HOSPITAL_COMMUNITY)
Admission: RE | Admit: 2017-03-03 | Discharge: 2017-03-03 | Disposition: A | Discharge status: Home / Self Care | Payer: Medicaid Other | Source: Ambulatory Visit | Attending: Family Medicine | Admitting: Family Medicine

## 2017-03-03 ENCOUNTER — Emergency Department (HOSPITAL_COMMUNITY)
Admission: EM | Admit: 2017-03-03 | Discharge: 2017-03-03 | Disposition: A | Discharge status: Home / Self Care | Payer: Medicaid Other | Attending: Emergency Medicine | Admitting: Emergency Medicine

## 2017-03-03 VITALS — BP 116/74 | HR 77 | Temp 98.2°F | Wt 115.0 lb

## 2017-03-03 DIAGNOSIS — N898 Other specified noninflammatory disorders of vagina: Secondary | ICD-10-CM | POA: Insufficient documentation

## 2017-03-03 DIAGNOSIS — N644 Mastodynia: Secondary | ICD-10-CM | POA: Diagnosis not present

## 2017-03-03 DIAGNOSIS — N3000 Acute cystitis without hematuria: Secondary | ICD-10-CM

## 2017-03-03 DIAGNOSIS — N390 Urinary tract infection, site not specified: Secondary | ICD-10-CM | POA: Insufficient documentation

## 2017-03-03 DIAGNOSIS — Z113 Encounter for screening for infections with a predominantly sexual mode of transmission: Secondary | ICD-10-CM | POA: Diagnosis not present

## 2017-03-03 DIAGNOSIS — N889 Noninflammatory disorder of cervix uteri, unspecified: Secondary | ICD-10-CM | POA: Insufficient documentation

## 2017-03-03 LAB — LIPASE, BLOOD: LIPASE: 26 U/L (ref 11–51)

## 2017-03-03 LAB — POCT WET PREP (WET MOUNT)
CLUE CELLS WET PREP WHIFF POC: NEGATIVE
Trichomonas Wet Prep HPF POC: ABSENT

## 2017-03-03 LAB — COMPREHENSIVE METABOLIC PANEL
ALT: 12 U/L — ABNORMAL LOW (ref 14–54)
AST: 15 U/L (ref 15–41)
Albumin: 4.3 g/dL (ref 3.5–5.0)
Alkaline Phosphatase: 50 U/L (ref 38–126)
Anion gap: 7 (ref 5–15)
BUN: 7 mg/dL (ref 6–20)
CHLORIDE: 105 mmol/L (ref 101–111)
CO2: 25 mmol/L (ref 22–32)
Calcium: 9.6 mg/dL (ref 8.9–10.3)
Creatinine, Ser: 0.59 mg/dL (ref 0.44–1.00)
Glucose, Bld: 84 mg/dL (ref 65–99)
POTASSIUM: 3.8 mmol/L (ref 3.5–5.1)
SODIUM: 137 mmol/L (ref 135–145)
Total Bilirubin: 1.2 mg/dL (ref 0.3–1.2)
Total Protein: 7.1 g/dL (ref 6.5–8.1)

## 2017-03-03 MED ORDER — SULFAMETHOXAZOLE-TRIMETHOPRIM 800-160 MG PO TABS: 1.0000 | ORAL_TABLET | Freq: Two times a day (BID) | ORAL | 0 refills | Status: AC

## 2017-03-03 NOTE — Patient Instructions (Signed)
Safe Sex Practicing safe sex means taking steps before and during sex to reduce your risk of:  Getting an STD (sexually transmitted disease).  Giving your partner an STD.  Unwanted pregnancy.  How can I practice safe sex?  To practice safe sex:  Limit your sexual partners to only one partner who is having sex with only you.  Avoid using alcohol and recreational drugs before having sex. These substances can affect your judgment.  Before having sex with a new partner: ? Talk to your partner about past partners, past STDs, and drug use. ? You and your partner should be screened for STDs and discuss the results with each other.  Check your body regularly for sores, blisters, rashes, or unusual discharge. If you notice any of these problems, visit your health care provider.  If you have symptoms of an infection or you are being treated for an STD, avoid sexual contact.  While having sex, use a condom. Make sure to: ? Use a condom every time you have vaginal, oral, or anal sex. Both females and males should wear condoms during oral sex. ? Keep condoms in place from the beginning to the end of sexual activity. ? Use a latex condom, if possible. Latex condoms offer the best protection. ? Use only water-based lubricants or oils to lubricate a condom. Using petroleum-based lubricants or oils will weaken the condom and increase the chance that it will break.  See your health care provider for regular screenings, exams, and tests for STDs.  Talk with your health care provider about the form of birth control (contraception) that is best for you.  Get vaccinated against hepatitis B and human papillomavirus (HPV).  If you are at risk of being infected with HIV (human immunodeficiency virus), talk with your health care provider about taking a prescription medicine to prevent HIV infection. You are considered at risk for HIV if: ? You are a man who has sex with other men. ? You are a  heterosexual man or woman who is sexually active with more than one partner. ? You take drugs by injection. ? You are sexually active with a partner who has HIV.  This information is not intended to replace advice given to you by your health care provider. Make sure you discuss any questions you have with your health care provider. Document Released: 09/29/2004 Document Revised: 01/06/2016 Document Reviewed: 07/12/2015 Elsevier Interactive Patient Education  2018 [employer]. Urinary Tract Infection, Adult A urinary tract infection (UTI) is an infection of any part of the urinary tract. The urinary tract includes the:  Kidneys.  Ureters.  Bladder.  Urethra.  These organs make, store, and get rid of pee (urine) in the body. Follow these instructions at home:  Take over-the-counter and prescription medicines only as told by your doctor.  If you were prescribed an antibiotic medicine, take it as told by your doctor. Do not stop taking the antibiotic even if you start to feel better.  Avoid the following drinks: ? Alcohol. ? Caffeine. ? Tea. ? Carbonated drinks.  Drink enough fluid to keep your pee clear or pale yellow.  Keep all follow-up visits as told by your doctor. This is important.  Make sure to: ? Empty your bladder often and completely. Do not to hold pee for long periods of time. ? Empty your bladder before and after sex. ? Wipe from front to back after a bowel movement if you are female. Use each tissue one time when you wipe.  Contact a doctor if:  You have back pain.  You have a fever.  You feel sick to your stomach (nauseous).  You throw up (vomit).  Your symptoms do not get better after 3 days.  Your symptoms go away and then come back. Get help right away if:  You have very bad back pain.  You have very bad lower belly (abdominal) pain.  You are throwing up and cannot keep down any medicines or water. This information is not intended to replace  advice given to you by your health care provider. Make sure you discuss any questions you have with your health care provider. Document Released: 02/08/2008 Document Revised: 01/28/2016 Document Reviewed: 07/13/2015 Elsevier Interactive Patient Education  [employer]2018 [employer] Inc[employer]

## 2017-03-03 NOTE — Progress Notes (Signed)
   Subjective:   Joanne Aguilar is a 21 y.o. female with a history of prior STD exposure, depression here for bilateral breast pain for 2 weeks.  She had a Nexplanon removal 01/11/2017 with similar symptoms following for a few weeks before subsiding.  LMP 01/14/2017, menses lasted 5 days.  States current breast pain is constant and worse with removing bra and with movement.  Also c/o abdominal cramps, low back pain, and white, creamy vaginal discharge for 2-3 days with associated increased urinary frequency and dysuria.  Currently sexually active with one partner and using no methods of birth control.  Review of Systems:  Per HPI.   Social History: former smoker  Objective:  BP 116/74   Pulse 77   Temp 98.2 F (36.8 C) (Oral)   Wt 115 lb (52.2 kg)   LMP 02/09/2017   SpO2 99%   BMI 21.03 kg/m   Gen:  21 y.o. female in NAD  HEENT: NCAT, MMM, anicteric sclerae CV: RRR, no MRG Resp: Non-labored, CTAB, no wheezes noted  Abd: Soft, tender suprapubically, BS present, no guarding or organomegaly Ext: no peripheral edema, gait intact MSK: Full ROM GYN:  External genitalia within normal limits.  Vaginal mucosa pink, moist, normal rugae.  Nonfriable cervix.  ~682mm yellow R cervical lesion, no discharge or bleeding noted on speculum exam.  Bimanual exam revealed normal, nongravid uterus.  No cervical motion tenderness. No adnexal masses bilaterally.   Breasts: breasts appear normal, no suspicious masses, no skin or nipple changes or axillary nodes, symmetric fibrous changes in both upper outer quadrants. Neuro: Alert and oriented, speech normal    U/A 03/02/2017: moderate leukocytes, WBC TNTC UPreg 03/02/2017: negative  Assessment & Plan:     Joanne Aguilar is a 21 y.o. female here for:  UTI (urinary tract infection) U/A 03/02/2017: Moderate leukocytes, WBC TNTC  - Bactrim DS 800-160 BID 7 days - Return precautions given.  Vaginal discharge Wet prep, HIV, RPR sent for analysis.  Will  follow results. Counseled on safe sex.  Cervical lesion ~452mm yellow R cervical lesion seen on exam - Pap smear sent for analysis.  Will follow results. - Will schedule for colposcopy f/u.  Breast pain in female Normal breast exam.  Counseled on hormonal changes with menstruation.   Ellwood Denseumball, Alison, DO  PGY-1,  [facility] 03/03/2017  11:43 AM

## 2017-03-03 NOTE — Progress Notes (Signed)
.  GEN

## 2017-03-03 NOTE — Assessment & Plan Note (Signed)
U/A 03/02/2017: Moderate leukocytes, WBC TNTC  - Bactrim DS 800-160 BID 7 days - Return precautions given.

## 2017-03-03 NOTE — Assessment & Plan Note (Signed)
Wet prep, HIV, RPR sent for analysis.  Will follow results. Counseled on safe sex.

## 2017-03-03 NOTE — ED Notes (Signed)
Pt stated she has not been seen yet however is unwilling to wait any longer. Pt walked out ED doors.

## 2017-03-03 NOTE — Assessment & Plan Note (Signed)
~  2mm yellow R cervical lesion seen on exam - Pap smear sent for analysis.  Will follow results. - Will schedule for colposcopy f/u.

## 2017-03-03 NOTE — Assessment & Plan Note (Signed)
Normal breast exam.  Counseled on hormonal changes with menstruation.

## 2017-03-04 LAB — HIV ANTIBODY (ROUTINE TESTING W REFLEX): HIV SCREEN 4TH GENERATION: NONREACTIVE

## 2017-03-04 LAB — RPR: RPR Ser Ql: NONREACTIVE

## 2017-03-09 LAB — CYTOLOGY - PAP
CHLAMYDIA, DNA PROBE: NEGATIVE
NEISSERIA GONORRHEA: NEGATIVE

## 2017-03-10 ENCOUNTER — Telehealth: Payer: Self-pay | Admitting: Family Medicine

## 2017-03-10 NOTE — Telephone Encounter (Signed)
Left voicemail with patient to call clinic back regarding her test results.

## 2017-03-13 ENCOUNTER — Telehealth: Payer: Self-pay | Admitting: Student in an Organized Health Care Education/Training Program

## 2017-03-13 ENCOUNTER — Telehealth: Payer: Self-pay | Admitting: Family Medicine

## 2017-03-13 NOTE — Telephone Encounter (Signed)
-----   Message from Ellwood DenseAlison Rumball, DO sent at 03/13/2017  9:39 AM EDT ----- Karl LukeHey Guys,  Could you let the patient know her HIV and RPR were negative but her pap smear results were abnormal.  I would like for her to come in to the Gyn clinic so we can get a closer look and possible biopsy.  Thanks!! Jill SideAlison

## 2017-03-13 NOTE — Telephone Encounter (Signed)
LM for patient to call back. Kacelyn Rowzee,CMA  

## 2017-03-13 NOTE — Telephone Encounter (Signed)
Pt returned drs call °

## 2017-03-13 NOTE — Telephone Encounter (Signed)
Pt is returning the doctor's call. jw °

## 2017-03-13 NOTE — Telephone Encounter (Signed)
Pt is returning your phone, Dr. Linwood Dibblesumball.

## 2017-03-13 NOTE — Telephone Encounter (Signed)
Pt is returning call. Please call her back at [phone]. Her phone connection was really bad and I ask like 4 times. jw

## 2017-03-13 NOTE — Telephone Encounter (Signed)
LM for patient to call back. Jinny Sweetland,CMA  

## 2017-03-13 NOTE — Telephone Encounter (Signed)
Tried calling patient at both numbers listed but there was no answer and neither had a valid VM. Joanne Aguilar,CMA

## 2017-03-13 NOTE — Telephone Encounter (Signed)
Left voicemail for patient to call clinic regarding lab results.  Ellwood DenseAlison Jovani Flury, DO PGY-1, [facility] 03/13/2017 8:17 AM

## 2017-03-15 NOTE — Telephone Encounter (Signed)
Patient informed of message from MD, first availability for colpo clinic is 7/26 but patient states she is leaving for [place] on 7/23 and not sure when she will be back. Patient asking if MD can do this before she leaves for [place].

## 2017-03-16 NOTE — Telephone Encounter (Signed)
Pt called back and wanted to schedule an appointment for the biopsy with PCP or someone else in the clinic, pt is moving to [place] on the 23rd and wants to get it done before she moves. ep

## 2017-03-16 NOTE — Telephone Encounter (Signed)
Spoke with Dr. Jennette KettleNeal regarding whether we can get this patient in for colposcopy before she moves. Dr. Jennette KettleNeal will check to see if there is colpo clinic where she may have availability.

## 2017-03-20 NOTE — Telephone Encounter (Signed)
Will follow up with MD to see when and if patient will be able to get this done before next week. Ebone Alcivar,CMA

## 2017-03-22 NOTE — Telephone Encounter (Signed)
Pt is upset she hasn't heard back, pt is leaving on Sunday. I spoke with Annice PihJackie, who will talk to Dr, Lum BabeEniola and call pt back [phone] ep

## 2017-03-24 ENCOUNTER — Inpatient Hospital Stay (HOSPITAL_COMMUNITY)
Admission: AD | Admit: 2017-03-24 | Discharge: 2017-03-24 | Disposition: A | Discharge status: Home / Self Care | Payer: Medicaid Other | Source: Ambulatory Visit | Attending: Obstetrics & Gynecology | Admitting: Obstetrics & Gynecology

## 2017-03-24 DIAGNOSIS — R109 Unspecified abdominal pain: Secondary | ICD-10-CM | POA: Insufficient documentation

## 2017-03-24 DIAGNOSIS — N939 Abnormal uterine and vaginal bleeding, unspecified: Secondary | ICD-10-CM | POA: Insufficient documentation

## 2017-03-24 DIAGNOSIS — Z3202 Encounter for pregnancy test, result negative: Secondary | ICD-10-CM | POA: Diagnosis not present

## 2017-03-24 LAB — URINALYSIS, ROUTINE W REFLEX MICROSCOPIC
Bilirubin Urine: NEGATIVE
Glucose, UA: NEGATIVE mg/dL
Hgb urine dipstick: NEGATIVE
Ketones, ur: NEGATIVE mg/dL
NITRITE: NEGATIVE
PH: 6 (ref 5.0–8.0)
Protein, ur: 30 mg/dL — AB
Specific Gravity, Urine: 1.021 (ref 1.005–1.030)

## 2017-03-24 LAB — POCT PREGNANCY, URINE: Preg Test, Ur: NEGATIVE

## 2017-03-24 NOTE — MAU Note (Signed)
Patient presents with having Implant BC removed in May.  Had positive HPV results.  Nausea:  On and off for past few months  Cramping - On and off for past few months as well  Spotting - 7/16 and 7/17

## 2017-03-27 NOTE — Telephone Encounter (Signed)
Will forward to Dr. Mosetta PuttFeng and Dr. Lum BabeEniola to see if this was ever addressed with the patient or if they had spoken with Annice PihJackie about her. Laquonda Welby,CMA

## 2017-03-27 NOTE — Telephone Encounter (Signed)
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 In any case, I called patient to put her on Gyn schedule for Thursday, I was informed by her grandmother that she already relocated to [place]. I apologize for the delay in response although I was not aware of this situation till now. Her was grandmother advised to have her establish PCP care in [place] and she can call us if she has any questions. She verbalized understanding.

## 2017-03-31 ENCOUNTER — Encounter: Payer: Self-pay | Admitting: Student in an Organized Health Care Education/Training Program

## 2019-03-21 ENCOUNTER — Other Ambulatory Visit: Payer: Self-pay

## 2019-03-21 ENCOUNTER — Emergency Department (HOSPITAL_COMMUNITY)
Admission: EM | Admit: 2019-03-21 | Discharge: 2019-03-21 | Disposition: A | Discharge status: Home / Self Care | Payer: Self-pay | Attending: Emergency Medicine | Admitting: Emergency Medicine

## 2019-03-21 ENCOUNTER — Encounter (HOSPITAL_COMMUNITY): Payer: Self-pay | Admitting: *Deleted

## 2019-03-21 DIAGNOSIS — N898 Other specified noninflammatory disorders of vagina: Secondary | ICD-10-CM

## 2019-03-21 DIAGNOSIS — Z87891 Personal history of nicotine dependence: Secondary | ICD-10-CM | POA: Insufficient documentation

## 2019-03-21 DIAGNOSIS — N3000 Acute cystitis without hematuria: Secondary | ICD-10-CM

## 2019-03-21 DIAGNOSIS — N3090 Cystitis, unspecified without hematuria: Secondary | ICD-10-CM | POA: Insufficient documentation

## 2019-03-21 DIAGNOSIS — N899 Noninflammatory disorder of vagina, unspecified: Secondary | ICD-10-CM | POA: Insufficient documentation

## 2019-03-21 LAB — URINALYSIS, ROUTINE W REFLEX MICROSCOPIC
Bilirubin Urine: NEGATIVE
Glucose, UA: NEGATIVE mg/dL
Ketones, ur: NEGATIVE mg/dL
Nitrite: NEGATIVE
Protein, ur: NEGATIVE mg/dL
Specific Gravity, Urine: 1.009 (ref 1.005–1.030)
pH: 6 (ref 5.0–8.0)

## 2019-03-21 LAB — I-STAT BETA HCG BLOOD, ED (MC, WL, AP ONLY): I-stat hCG, quantitative: <5 m[IU]/mL (ref <5)

## 2019-03-21 MED ORDER — AZITHROMYCIN 250 MG PO TABS
1000.0000 mg | ORAL_TABLET | Freq: Once | ORAL | Status: AC
  Administered 2019-03-21: 16:00:00 1000 mg via ORAL
  Filled 2019-03-21: qty 4

## 2019-03-21 MED ORDER — CEFTRIAXONE SODIUM 250 MG IJ SOLR
250.0000 mg | Freq: Once | INTRAMUSCULAR | Status: AC
  Administered 2019-03-21: 250 mg via INTRAMUSCULAR
  Filled 2019-03-21: qty 250

## 2019-03-21 MED ORDER — METRONIDAZOLE 500 MG PO TABS
2000.0000 mg | ORAL_TABLET | Freq: Once | ORAL | Status: AC
  Administered 2019-03-21: 2000 mg via ORAL
  Filled 2019-03-21: qty 4

## 2019-03-21 MED ORDER — CEPHALEXIN 250 MG PO CAPS: 250.0000 mg | ORAL_CAPSULE | Freq: Four times a day (QID) | ORAL | 0 refills | Status: AC

## 2019-03-21 NOTE — ED Provider Notes (Signed)
[facility] Provider Note   CSN: [id number] Arrival date & time: 03/21/19  1436     History   Chief Complaint Chief Complaint  Patient presents with  . Urinary Tract Infection    HPI Joanne Aguilar is a 23 y.o. female presenting for evaluation of dysuria, abnormal urinary odor, and vaginal discharge.  Patient states that the past 4 weeks, she has been having urinary symptoms.  She states her urine feels hot, but not burning.  She also reports it has an abnormal smells..  She reports increased vaginal discharge and intermittent lower abdominal discomfort, but no pain.  She states this began after she was sexually active with a new partner 3 weeks ago.  She denies fevers, chills, nausea, vomiting, or normal bowel movements.  She did not use protection when she was sexually active most recently.  She has had 2 sexual partners in the past several months, both female.  She is not sure if either has had symptoms.  She has been tested for STDs in the past, has been positive once and she was treated at that time.  She states she has no other medical problems, takes no medications daily.  No history of abdominal surgeries.  Patient's last period was last month, she is normally very regular.     HPI  Past Medical History:  Diagnosis Date  . Anemia   . Kidney stones     Patient Active Problem List   Diagnosis Date Noted  . UTI (urinary tract infection) 03/03/2017  . Breast pain in female 03/03/2017  . Cervical lesion 03/03/2017  . Nexplanon removal 01/12/2017  . Vaginal discharge 12/20/2016  . Depression 12/20/2016  . STD exposure 12/20/2016  . NSVD (normal spontaneous vaginal delivery) 05/09/2015    Past Surgical History:  Procedure Laterality Date  . TOOTH EXTRACTION       OB History    Gravida  2   Para  1   Term  1   Preterm      AB      Living  1     SAB      TAB      Ectopic      Multiple  0   Live Births  1             Home Medications    Prior to Admission medications   Medication Sig Start Date End Date Taking? Authorizing Provider  cephALEXin (KEFLEX) 250 MG capsule Take 1 capsule (250 mg total) by mouth 4 (four) times daily for 7 days. 03/21/19 03/28/19  Kelce Bouton, PA-C    Family History Family History  Problem Relation Age of Onset  . Hypertension Maternal Grandmother     Social History Social History   Tobacco Use  . Smoking status: Former Smoker    Types: Cigarettes  . Smokeless tobacco: Never Used  Substance Use Topics  . Alcohol use: Yes    Comment: socially  . 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   Review of Systems Review of Systems  Gastrointestinal:       Lower abdominal discomfort  Genitourinary: Positive for dysuria and vaginal discharge.  All other systems reviewed and are negative.    Physical Exam Updated Vital Signs BP 112/74 (BP Location: Right Arm)   Pulse 84   Temp 98.7 F (37.1 C) (  Oral)   Resp 14   Ht 5\' 2"  (1.575 m)   Wt 72.6 kg   LMP 02/16/2019   SpO2 100%   BMI 29.26 kg/m   Physical Exam Vitals signs and nursing note reviewed. Exam conducted with a chaperone present.  Constitutional:      General: She is not in acute distress.    Appearance: She is well-developed.     Comments: Sitting comfortably in the chair no acute distress  HENT:     Head: Normocephalic and atraumatic.  Eyes:     Conjunctiva/sclera: Conjunctivae normal.     Pupils: Pupils are equal, round, and reactive to light.  Neck:     Musculoskeletal: Normal range of motion and neck supple.  Cardiovascular:     Rate and Rhythm: Normal rate and regular rhythm.     Pulses: Normal pulses.  Pulmonary:     Effort: Pulmonary effort is normal. No respiratory distress.     Breath sounds: Normal breath sounds. No wheezing.  Abdominal:     General: There is no distension.      Palpations: Abdomen is soft. There is no mass.     Tenderness: There is no abdominal tenderness. There is no guarding or rebound.     Comments: No tenderness palpation the abdomen.  Soft without rigidity, guarding, distention.  Negative rebound.  Genitourinary:    Vagina: Normal.     Cervix: Discharge present. No cervical bleeding.     Uterus: Normal. Not tender.      Adnexa: Right adnexa normal and left adnexa normal.     Comments: Thin knee, yellow tinted discharge noted on exam coming from the cervix.  No CMT or adnexal tenderness.  No bleeding.  No lesions. Musculoskeletal: Normal range of motion.  Skin:    General: Skin is warm and dry.  Neurological:     Mental Status: She is alert and oriented to person, place, and time.      ED Treatments / Results  Labs (all labs ordered are listed, but only abnormal results are displayed) Labs Reviewed  URINALYSIS, ROUTINE W REFLEX MICROSCOPIC - Abnormal; Notable for the following components:      Result Value   APPearance HAZY (*)    Hgb urine dipstick SMALL (*)    Leukocytes,Ua LARGE (*)    Bacteria, UA FEW (*)    All other components within normal limits  WET PREP, GENITAL  RPR  HIV ANTIBODY (ROUTINE TESTING W REFLEX)  I-STAT BETA HCG BLOOD, ED ([facility], [facility], [facility] ONLY)  GC/CHLAMYDIA PROBE AMP ([place]) NOT AT [facility]    EKG None  Radiology No results found.  Procedures Procedures (including critical care time)  Medications Ordered in ED Medications  cefTRIAXone (ROCEPHIN) injection 250 mg (250 mg Intramuscular Given 03/21/19 1612)  azithromycin (ZITHROMAX) tablet 1,000 mg (1,000 mg Oral Given 03/21/19 1611)  metroNIDAZOLE (FLAGYL) tablet 2,000 mg (2,000 mg Oral Given 03/21/19 1611)     Initial Impression / Assessment and Plan / ED Course  I have reviewed the triage vital signs and the nursing notes.  Pertinent labs & imaging results that were available during my care of the patient were reviewed by me and considered in my  medical decision making (see chart for details).        Patient presenting for evaluation of urinary symptoms and vaginal discharge.  Physical exam reassuring, she appears nontoxic.  On pelvic exam, patient does have yellow tinted vaginal discharge coming from the cervix.  Consider STD  such as gonorrhea chlamydia.  No CMT or adnexal tenderness, low suspicion for PID or TOA.  Will also order urine to assess for UTI.  Urine positive for infection with leuks and bacteria.  Pregnancy negative.  Wet prep spilled, and thus not resulted.  Will treat for presumed trichomonas, as I have high suspicion for STD.  Will hold on treatment for BV or yeast, as exam is not consistent with this.  As patient is having discharge, will treat for gonorrhea and chlamydia.  Discussed that results for gonorrhea, chlamydia, HIV, and syphilis are pending.  Discussed follow-up with health department if symptoms not improving or if HIV or syphilis is positive.  At this time, patient appears safe for discharge.  Return precautions given.  Patient states she understands and agrees to plan.  Final Clinical Impressions(s) / ED Diagnoses   Final diagnoses:  Acute cystitis without hematuria  Vaginal discharge    ED Discharge Orders         Ordered    cephALEXin (KEFLEX) 250 MG capsule  4 times daily     03/21/19 1551           [place], Zuria Fosdick, PA-C 03/21/19 1625    Virgel Manifold, MD 03/22/19 1105

## 2019-03-21 NOTE — ED Triage Notes (Signed)
Pt unsure when Lower ABD  Cramps with discharge.

## 2019-03-21 NOTE — Discharge Instructions (Addendum)
Take Keflex as prescribed.  This is treating her UTI.  Make sure you stay well-hydrated water.  Your urine should be clear to pale yellow. You were tested for gonorrhea and chlamydia.  If results are positive, you should receive a phone call.  You have already been treated for this, so you will not need retreatment.  However you will need to let your sexual partners know abstain from sex for 2 weeks.  If negative, you will not receive a phone call.  Either way, you may check online on MyChart. You are also tested for HIV and syphilis.  If results are positive, you need to follow-up with health department. If you are still having vaginal discharge or irritation, follow-up with health department for further evaluation. Return to the emergency room if you develop high fevers, persistent vomiting, severe worsening, pain, or any new, worsening, or concerning symptoms.

## 2019-03-22 LAB — HIV ANTIBODY (ROUTINE TESTING W REFLEX): HIV Screen 4th Generation wRfx: NONREACTIVE

## 2019-03-22 LAB — RPR: RPR Ser Ql: NONREACTIVE

## 2019-03-26 LAB — GC/CHLAMYDIA PROBE AMP (~~LOC~~) NOT AT ARMC
Chlamydia: NEGATIVE
Neisseria Gonorrhea: NEGATIVE

## 2019-09-30 ENCOUNTER — Other Ambulatory Visit: Payer: Self-pay

## 2019-09-30 ENCOUNTER — Emergency Department (HOSPITAL_COMMUNITY)
Admission: EM | Admit: 2019-09-30 | Discharge: 2019-09-30 | Disposition: A | Discharge status: Home / Self Care | Payer: Self-pay | Attending: Emergency Medicine | Admitting: Emergency Medicine

## 2019-09-30 ENCOUNTER — Encounter (HOSPITAL_COMMUNITY): Payer: Self-pay | Admitting: *Deleted

## 2019-09-30 DIAGNOSIS — R300 Dysuria: Secondary | ICD-10-CM | POA: Insufficient documentation

## 2019-09-30 DIAGNOSIS — N939 Abnormal uterine and vaginal bleeding, unspecified: Secondary | ICD-10-CM | POA: Insufficient documentation

## 2019-09-30 LAB — URINALYSIS, ROUTINE W REFLEX MICROSCOPIC
Bacteria, UA: NONE SEEN
Bilirubin Urine: NEGATIVE
Glucose, UA: NEGATIVE mg/dL
Ketones, ur: NEGATIVE mg/dL
Leukocytes,Ua: NEGATIVE
Nitrite: NEGATIVE
Protein, ur: NEGATIVE mg/dL
Specific Gravity, Urine: 1.023 (ref 1.005–1.030)
pH: 6 (ref 5.0–8.0)

## 2019-09-30 LAB — CBC WITH DIFFERENTIAL/PLATELET
Abs Immature Granulocytes: 0.02 10*3/uL (ref 0.00–0.07)
Basophils Absolute: 0.1 10*3/uL (ref 0.0–0.1)
Basophils Relative: 1 %
Eosinophils Absolute: 0.3 10*3/uL (ref 0.0–0.5)
Eosinophils Relative: 4 %
HCT: 33.2 % — ABNORMAL LOW (ref 36.0–46.0)
Hemoglobin: 10.7 g/dL — ABNORMAL LOW (ref 12.0–15.0)
Immature Granulocytes: 0 %
Lymphocytes Relative: 38 %
Lymphs Abs: 2.4 10*3/uL (ref 0.7–4.0)
MCH: 29.5 pg (ref 26.0–34.0)
MCHC: 32.2 g/dL (ref 30.0–36.0)
MCV: 91.5 fL (ref 80.0–100.0)
Monocytes Absolute: 0.5 10*3/uL (ref 0.1–1.0)
Monocytes Relative: 8 %
Neutro Abs: 3.1 10*3/uL (ref 1.7–7.7)
Neutrophils Relative %: 49 %
Platelets: 136 10*3/uL — ABNORMAL LOW (ref 150–400)
RBC: 3.63 MIL/uL — ABNORMAL LOW (ref 3.87–5.11)
RDW: 14.6 % (ref 11.5–15.5)
WBC: 6.4 10*3/uL (ref 4.0–10.5)
nRBC: 0 % (ref 0.0–0.2)

## 2019-09-30 LAB — COMPREHENSIVE METABOLIC PANEL
ALT: 16 U/L (ref 0–44)
AST: 18 U/L (ref 15–41)
Albumin: 3.2 g/dL — ABNORMAL LOW (ref 3.5–5.0)
Alkaline Phosphatase: 54 U/L (ref 38–126)
Anion gap: 9 (ref 5–15)
BUN: 13 mg/dL (ref 6–20)
CO2: 22 mmol/L (ref 22–32)
Calcium: 8.8 mg/dL — ABNORMAL LOW (ref 8.9–10.3)
Chloride: 108 mmol/L (ref 98–111)
Creatinine, Ser: 0.59 mg/dL (ref 0.44–1.00)
GFR calc Af Amer: >60 mL/min (ref >60)
GFR calc non Af Amer: >60 mL/min (ref >60)
Glucose, Bld: 108 mg/dL — ABNORMAL HIGH (ref 70–99)
Potassium: 3.8 mmol/L (ref 3.5–5.1)
Sodium: 139 mmol/L (ref 135–145)
Total Bilirubin: 0.6 mg/dL (ref 0.3–1.2)
Total Protein: 5.5 g/dL — ABNORMAL LOW (ref 6.5–8.1)

## 2019-09-30 LAB — I-STAT BETA HCG BLOOD, ED (MC, WL, AP ONLY): I-stat hCG, quantitative: <5 m[IU]/mL (ref <5)

## 2019-09-30 NOTE — ED Provider Notes (Signed)
[facility] EMERGENCY DEPARTMENT Provider Note   CSN: [id number] Arrival date & time: 09/30/19  6269     History Chief Complaint  Patient presents with  . Vaginal Discharge    Joanne Aguilar is a 24 y.o. female.  The history is provided by the patient and medical records. No language interpreter was used.  Dysuria Pain quality:  Sharp Pain severity:  Mild Onset quality:  Gradual Duration:  3 days Timing:  Constant Progression:  Waxing and waning Chronicity:  Recurrent Recent urinary tract infections: no   Relieved by:  Nothing Worsened by:  Nothing Ineffective treatments:  None tried Associated symptoms: no abdominal pain, no fever, no flank pain, no genital lesions, no nausea, no vaginal discharge (resolved) and no vomiting   Risk factors: sexually transmitted infections (recent gonrtrohea)        Past Medical History:  Diagnosis Date  . Anemia   . Kidney stones     Patient Active Problem List   Diagnosis Date Noted  . UTI (urinary tract infection) 03/03/2017  . Breast pain in female 03/03/2017  . Cervical lesion 03/03/2017  . Nexplanon removal 01/12/2017  . Vaginal discharge 12/20/2016  . Depression 12/20/2016  . STD exposure 12/20/2016  . NSVD (normal spontaneous vaginal delivery) 05/09/2015    Past Surgical History:  Procedure Laterality Date  . TOOTH EXTRACTION       OB History    Gravida  2   Para  1   Term  1   Preterm      AB      Living  1     SAB      TAB      Ectopic      Multiple  0   Live Births  1           Family History  Problem Relation Age of Onset  . Hypertension Maternal Grandmother     Social History   Tobacco Use  . Smoking status: Former Smoker    Types: Cigarettes  . Smokeless tobacco: Never Used  Substance Use Topics  . Alcohol use: Yes    Comment: socially  . Drug use: Yes    Types: Marijuana    Comment: None since before pregnancy    Home Medications Prior to  Admission medications   Not on File    Allergies    Patient has no known allergies.  Review of Systems   Review of Systems  Constitutional: Negative for chills, diaphoresis, fatigue and fever.  HENT: Negative for congestion.   Respiratory: Negative for cough, chest tightness, shortness of breath and stridor.   Gastrointestinal: Negative for abdominal pain, nausea and vomiting.  Genitourinary: Positive for dysuria, vaginal bleeding and vaginal pain. Negative for difficulty urinating, flank pain, frequency and vaginal discharge (resolved).  Musculoskeletal: Negative for back pain, neck pain and neck stiffness.  Neurological: Negative for headaches.  Psychiatric/Behavioral: Negative for agitation.  All other systems reviewed and are negative.   Physical Exam Updated Vital Signs BP 134/89 (BP Location: Right Arm)   Pulse 89   Temp 98.7 F (37.1 C) (Oral)   Resp 17   Ht 5\' 3"  (1.6 m)   Wt 67.1 kg   LMP 09/30/2019   SpO2 100%   BMI 26.22 kg/m   Physical Exam Vitals and nursing note reviewed. Exam conducted with a chaperone present.  Constitutional:      General: She is not in acute distress.    Appearance: Normal appearance.  She is well-developed. She is not ill-appearing, toxic-appearing or diaphoretic.  HENT:     Head: Normocephalic and atraumatic.     Right Ear: External ear normal.     Left Ear: External ear normal.     Nose: Nose normal.     Mouth/Throat:     Mouth: Mucous membranes are moist.     Pharynx: No oropharyngeal exudate or posterior oropharyngeal erythema.  Eyes:     Conjunctiva/sclera: Conjunctivae normal.     Pupils: Pupils are equal, round, and reactive to light.  Cardiovascular:     Rate and Rhythm: Normal rate.     Pulses: Normal pulses.     Heart sounds: No murmur.  Pulmonary:     Effort: Pulmonary effort is normal. No respiratory distress.     Breath sounds: No stridor. No wheezing, rhonchi or rales.  Chest:     Chest wall: No tenderness.    Abdominal:     General: There is no distension.     Tenderness: There is no abdominal tenderness. There is no right CVA tenderness, left CVA tenderness or rebound.  Genitourinary:    General: Normal vulva.     Vagina: No vaginal discharge.     Cervix: Cervical bleeding present. No cervical motion tenderness, discharge, friability or erythema.     Uterus: Normal.      Adnexa:        Right: No tenderness.         Left: No tenderness.       Comments: Some bleeding seen in the cervical os.  No CMT.  No adnexal tenderness.  No discharge seen.  Swabs collected. Musculoskeletal:        General: No tenderness.     Cervical back: Normal range of motion and neck supple.  Skin:    General: Skin is warm.     Findings: No erythema or rash.  Neurological:     Mental Status: She is alert and oriented to person, place, and time.     Motor: No abnormal muscle tone.     Deep Tendon Reflexes: Reflexes are normal and symmetric.  Psychiatric:        Mood and Affect: Mood normal.     ED Results / Procedures / Treatments   Labs (all labs ordered are listed, but only abnormal results are displayed) Labs Reviewed  CBC WITH DIFFERENTIAL/PLATELET - Abnormal; Notable for the following components:      Result Value   RBC 3.63 (*)    Hemoglobin 10.7 (*)    HCT 33.2 (*)    Platelets 136 (*)    All other components within normal limits  COMPREHENSIVE METABOLIC PANEL - Abnormal; Notable for the following components:   Glucose, Bld 108 (*)    Calcium 8.8 (*)    Total Protein 5.5 (*)    Albumin 3.2 (*)    All other components within normal limits  URINALYSIS, ROUTINE W REFLEX MICROSCOPIC - Abnormal; Notable for the following components:   Hgb urine dipstick LARGE (*)    All other components within normal limits  URINE CULTURE  I-STAT BETA HCG BLOOD, ED ([facility], [facility], [facility] ONLY)  GC/CHLAMYDIA PROBE AMP ([place]) NOT AT [facility]  WET PREP  (BD AFFIRM) ([place])    EKG None  Radiology No results  found.  Procedures Procedures (including critical care time)  Medications Ordered in ED Medications - No data to display  ED Course  I have reviewed the triage vital signs and the  nursing notes.  Pertinent labs & imaging results that were available during my care of the patient were reviewed by me and considered in my medical decision making (see chart for details).    MDM Rules/Calculators/A&P                      Joanne Aguilar is a 24 y.o. female with a past medical history significant for kidney stones, prior UTI, and recent treatment for gonorrhea who presents with some dysuria, possible pelvic pain, and possible vaginal discharge.  She reports that several weeks ago she was treated for gonorrhea.  She is concerned that it still may be ongoing.  She says that for the last 2 days she has had some sharp pain when she urinates.  She denies any hematuria or discoloration of the urine.  She reports that she is now on her menstrual cycle and she is unsure if she is having any vaginal discharge or not but she wants to be rechecked for gonorrhea again.  She reports that she was having some discomfort in her pelvic area but reports it is not severe.  She denies any upper abdominal pain.  Of note, she reports that she had diarrhea a week or 2 ago in context of taking laxatives but that resolved.  As she is enlisting in [employer] in the near future, they told her to go to the emergency department to get evaluated and make sure her abdomen is "cleared".  She reports he is having no further constipation or diarrhea.  She denies any nausea or vomiting.  She denies fevers, chills congestion, cough, or any Covid symptoms.  She denies any Covid contacts.  Her only real complaint today is the dysuria and the possible pelvic pain.  On exam, abdomen is nontender.  Normal bowel sounds.  Lungs clear and chest is nontender.  Patient in no distress and is resting comfortably.  No CVA tenderness or back  tenderness.  Vital signs reassuring on arrival.  Had a shared decision made conversation with patient and we agreed to do several things.  We will do a pelvic exam to see if she is having adnexal tenderness or cervical motion tenderness.  We will get repeat gonorrhea/committee test as well as a wet prep.  If she is having severe tenderness, will consider pelvic ultrasound to rule out TOA developing in the context of her recent gonorrhea.  If she is nontender, will hold on ultrasound.  For the " abdominal clearance", and her recent diarrhea, we will get some screening blood work.  If blood work is reassuring and she is feeling better, anticipate discharge home to continue outpatient management by the [employer].  Anticipate reassessment after work-up.     8:32 AM Pelvic exam performed with chaperone.  Patient had no vaginal discharge.  She does have vaginal bleeding that was seen in the cervical os as expected.  No cervical motion tenderness or adnexal tenderness.  With lack of tenderness, do not feel she needs pelvic ultrasound at this time.  Pregnancy test negative.  Urinalysis does not show UTI.  When her labs have otherwise returned, dissipate discharge to follow-up with PCP.  11:03 AM Labs revealed similar anemia to prior and labs are otherwise reassuring.  Hemoglobin was in the 10 range which she has had before.  With lack of pelvic tenderness, do not feel she needs ultrasound, low suspicion for TOA.  Patient does not want to wait for the results of her wet  prep.  Patient will follow up with my chart, PCP, or OB/GYN for these results.  With lack of discharge, lower suspicion for BV at this time.  Will hold on new antibiotics unless those results.  She can follow-up for that.  Patient requested a note to say she is cleared for activity and given her reassuring work-up, I feel she is safe for activity this time.  Note was provided.  Patient understood return precautions and was discharged in good  condition.    Final Clinical Impression(s) / ED Diagnoses Final diagnoses:  Vaginal bleeding  Dysuria    Rx / DC Orders ED Discharge Orders    None      Clinical Impression: 1. Vaginal bleeding   2. Dysuria     Disposition: Discharge  Condition: Good  I have discussed the results, Dx and Tx plan with the pt(& family if present). He/she/they expressed understanding and agree(s) with the plan. Discharge instructions discussed at great length. Strict return precautions discussed and pt &/or family have verbalized understanding of the instructions. No further questions at time of discharge.    New Prescriptions   No medications on file    Follow Up: [facility] [address] [place] [address] [phone] Schedule an appointment as soon as possible for a visit    [place] [address] [phone] mc [place]       Qaadir Kent, Gwenyth Allegra, MD 09/30/19 2008

## 2019-09-30 NOTE — Discharge Instructions (Signed)
Your work-up today was overall reassuring.  Your abdominal exam did not reveal any tenderness and your labs did not show evidence of kidney dysfunction or liver dysfunction.  There was no evidence of urinary tract infection with your painful urination.  You had multiple swabs performed to assess for recurrent pelvic infection however those tests will not return today.  As your pelvic exam did not reveal tenderness, we had a very low suspicion for other infection and we did not feel you needed ultrasound at this time.  Please follow-up with either my-chart, your PCP, or OB/GYN for those other swab results.  Please rest and stay hydrated.  Based on your reassuring work-up, history, exam, I do not feel there is any reason to restrict your activity at this time.

## 2019-09-30 NOTE — ED Triage Notes (Signed)
The pt is c/o vaginal discomfort  Maybe a discharge  She has had for 2 days   She cannot tell if she has had a discharge because she is on her period   No pain at present

## 2019-10-01 LAB — URINE CULTURE: Culture: NO GROWTH

## 2019-10-01 LAB — GC/CHLAMYDIA PROBE AMP (~~LOC~~) NOT AT ARMC
Chlamydia: NEGATIVE
Neisseria Gonorrhea: NEGATIVE

## 2020-03-26 ENCOUNTER — Other Ambulatory Visit: Payer: Self-pay

## 2020-03-26 ENCOUNTER — Inpatient Hospital Stay (HOSPITAL_COMMUNITY)
Admission: EM | Admit: 2020-03-26 | Discharge: 2020-03-27 | Disposition: A | Discharge status: Home / Self Care | Payer: Medicaid Other | Attending: Obstetrics and Gynecology | Admitting: Obstetrics and Gynecology

## 2020-03-26 ENCOUNTER — Encounter (HOSPITAL_COMMUNITY): Payer: Self-pay | Admitting: Obstetrics and Gynecology

## 2020-03-26 DIAGNOSIS — O0931 Supervision of pregnancy with insufficient antenatal care, first trimester: Secondary | ICD-10-CM | POA: Insufficient documentation

## 2020-03-26 DIAGNOSIS — O3481 Maternal care for other abnormalities of pelvic organs, first trimester: Secondary | ICD-10-CM | POA: Diagnosis not present

## 2020-03-26 DIAGNOSIS — O3680X0 Pregnancy with inconclusive fetal viability, not applicable or unspecified: Secondary | ICD-10-CM | POA: Diagnosis not present

## 2020-03-26 DIAGNOSIS — O26851 Spotting complicating pregnancy, first trimester: Secondary | ICD-10-CM | POA: Insufficient documentation

## 2020-03-26 DIAGNOSIS — O26891 Other specified pregnancy related conditions, first trimester: Secondary | ICD-10-CM | POA: Diagnosis not present

## 2020-03-26 DIAGNOSIS — Z3A01 Less than 8 weeks gestation of pregnancy: Secondary | ICD-10-CM | POA: Insufficient documentation

## 2020-03-26 DIAGNOSIS — O219 Vomiting of pregnancy, unspecified: Secondary | ICD-10-CM | POA: Diagnosis not present

## 2020-03-26 DIAGNOSIS — R102 Pelvic and perineal pain: Secondary | ICD-10-CM | POA: Diagnosis not present

## 2020-03-26 DIAGNOSIS — O209 Hemorrhage in early pregnancy, unspecified: Secondary | ICD-10-CM

## 2020-03-26 DIAGNOSIS — R109 Unspecified abdominal pain: Secondary | ICD-10-CM | POA: Diagnosis not present

## 2020-03-26 DIAGNOSIS — Z87891 Personal history of nicotine dependence: Secondary | ICD-10-CM | POA: Diagnosis not present

## 2020-03-26 DIAGNOSIS — O26899 Other specified pregnancy related conditions, unspecified trimester: Secondary | ICD-10-CM

## 2020-03-26 LAB — URINALYSIS, ROUTINE W REFLEX MICROSCOPIC
Bilirubin Urine: NEGATIVE
Glucose, UA: NEGATIVE mg/dL
Hgb urine dipstick: NEGATIVE
Ketones, ur: 20 mg/dL — AB
Leukocytes,Ua: NEGATIVE
Nitrite: NEGATIVE
Protein, ur: NEGATIVE mg/dL
Specific Gravity, Urine: 1.024 (ref 1.005–1.030)
pH: 5 (ref 5.0–8.0)

## 2020-03-26 LAB — WET PREP, GENITAL
Sperm: NONE SEEN
Trich, Wet Prep: NONE SEEN
Yeast Wet Prep HPF POC: NONE SEEN

## 2020-03-26 LAB — POC URINE PREG, ED: Preg Test, Ur: POSITIVE — AB

## 2020-03-26 MED ORDER — LACTATED RINGERS IV SOLN: INTRAVENOUS | Status: DC

## 2020-03-26 MED ORDER — PROMETHAZINE HCL 25 MG/ML IJ SOLN
12.5000 mg | Freq: Once | INTRAMUSCULAR | Status: AC
  Administered 2020-03-27: 12.5 mg via INTRAVENOUS
  Filled 2020-03-26: qty 1

## 2020-03-26 NOTE — ED Provider Notes (Signed)
Patient was seen and examined, reports somewhere around 5 or [redacted] weeks pregnant with some spotting, has been very nauseated for several days and is now having some dark-colored urine.  On exam has a very soft abdomen, no tachycardia or difficulty breathing, appears mildly dehydrated, discussed with [facility], has accepted transfer of patient for evaluation and treatment of her condition.  Suspect hyperemesis gravidarum.   Eber Hong, MD 03/26/20 2108

## 2020-03-26 NOTE — ED Triage Notes (Signed)
Pt arrives to ED w/ c/o n/v. Pt states she is 5-[redacted] weeks pregnant. Pt endorses intermittent abdominal pain and spotting.

## 2020-03-26 NOTE — MAU Note (Signed)
Pt went to main ED with n/v. Pt had positive upt and confirmed at ED. Sent to MAU. Report n/v for couple days. Mild discomfort LLQ. Spotted about 3 days ago but none since.

## 2020-03-26 NOTE — MAU Provider Note (Signed)
Chief Complaint: Nausea   First Provider Initiated Contact with Patient 03/26/20 2254        SUBJECTIVE HPI: Joanne Aguilar is a 24 y.o. G2P1001 at Unknown by LMP who presents to maternity admissions reporting nausea and vomiting for a week or so but worse today.  Also has some pelvic cramping with some spotting this week. . She denies vaginal bleeding, vaginal itching/burning, urinary symptoms, h/a, dizziness, n/v, or fever/chills.    Has not started prenatal care yet. Just moved here.  [facility] delivery with [facility]  Emesis  This is a recurrent problem. The current episode started in the past 7 days. The problem has been unchanged. There has been no fever. Associated symptoms include abdominal pain (cramping). Pertinent negatives include no chills, diarrhea, dizziness or fever. She has tried nothing for the symptoms.   ED Note: Patient was seen and examined, reports somewhere around 5 or [redacted] weeks pregnant with some spotting, has been very nauseated for several days and is now having some dark-colored urine.  On exam has a very soft abdomen, no tachycardia or difficulty breathing, appears mildly dehydrated, discussed with [facility] APP, has accepted transfer of patient for evaluation and treatment of her condition.  Suspect hyperemesis gravidarum.  Eber Hong, MD 03/26/20 2108  Past Medical History:  Diagnosis Date  . Anemia   . Kidney stones    Past Surgical History:  Procedure Laterality Date  . TOOTH EXTRACTION     Social History   Socioeconomic History  . Marital status: Single    Spouse name: Not on file  . Number of children: Not on file  . Years of education: Not on file  . Highest education level: Not on file  Occupational History  . Not on file  Tobacco Use  . Smoking status: Former Smoker    Types: Cigarettes  . Smokeless tobacco: Never Used  Substance and Sexual Activity  . Alcohol use: Yes    Comment: socially  . Drug use: Yes    Types:  Marijuana    Comment: None since before pregnancy  . Sexual activity: Yes    Birth control/protection: None  Other Topics Concern  . Not on file  Social History Narrative  . Not on file   Social Determinants of Health   Financial Resource Strain:   . Difficulty of Paying Living Expenses:   Food Insecurity:   . Worried About Programme researcher, broadcasting/film/video in the Last Year:   . Barista in the Last Year:   Transportation Needs:   . Freight forwarder (Medical):   Marland Kitchen Lack of Transportation (Non-Medical):   Physical Activity:   . Days of Exercise per Week:   . Minutes of Exercise per Session:   Stress:   . Feeling of Stress :   Social Connections:   . Frequency of Communication with Friends and Family:   . Frequency of Social Gatherings with Friends and Family:   . Attends Religious Services:   . Active Member of Clubs or Organizations:   . Attends Banker Meetings:   Marland Kitchen Marital Status:   Intimate Partner Violence:   . Fear of Current or Ex-Partner:   . Emotionally Abused:   Marland Kitchen Physically Abused:   . Sexually Abused:    No current facility-administered medications on file prior to encounter.   Current Outpatient Medications on File Prior to Encounter  Medication Sig Dispense Refill  . Prenatal Vit-Fe Fumarate-FA (PRENATAL MULTIVITAMIN) TABS tablet Take 1  tablet by mouth daily at 12 noon.     No Known Allergies  I have reviewed patient's Past Medical Hx, Surgical Hx, Family Hx, Social Hx, medications and allergies.   ROS:  Review of Systems  Constitutional: Negative for chills and fever.  Respiratory: Negative for shortness of breath.   Gastrointestinal: Positive for abdominal pain (cramping), nausea and vomiting. Negative for constipation and diarrhea.  Genitourinary: Positive for pelvic pain and vaginal bleeding.  Neurological: Negative for dizziness.   Review of Systems  Other systems negative   Physical Exam  Physical Exam Patient Vitals for the  past 24 hrs:  BP Temp Temp src Pulse Resp SpO2 Height Weight  03/26/20 2234 126/79 -- -- 80 -- -- -- --  03/26/20 2233 -- 98.9 F (37.2 C) -- -- 16 -- -- --  03/26/20 2053 126/77 98.5 F (36.9 C) Oral 82 16 100 % 5\' 3"  (1.6 m) 67.6 kg   Constitutional: Well-developed, well-nourished female in no acute distress.  Cardiovascular: normal rate Respiratory: normal effort GI: Abd soft, non-tender. Pos BS x 4 MS: Extremities nontender, no edema, normal ROM Neurologic: Alert and oriented x 4.  GU: Neg CVAT.  PELVIC EXAM: Cervix pink, visually closed, without lesion, scant white creamy discharge (no blood), vaginal walls and external genitalia normal   LAB RESULTS Results for orders placed or performed during the hospital encounter of 03/26/20 (from the past 24 hour(s))  POC Urine Pregnancy, ED (not at [facility])     Status: Abnormal   Collection Time: 03/26/20  9:13 PM  Result Value Ref Range   Preg Test, Ur POSITIVE (A) NEGATIVE  Urinalysis, Routine w reflex microscopic     Status: Abnormal   Collection Time: 03/26/20 10:43 PM  Result Value Ref Range   Color, Urine YELLOW YELLOW   APPearance HAZY (A) CLEAR   Specific Gravity, Urine 1.024 1.005 - 1.030   pH 5.0 5.0 - 8.0   Glucose, UA NEGATIVE NEGATIVE mg/dL   Hgb urine dipstick NEGATIVE NEGATIVE   Bilirubin Urine NEGATIVE NEGATIVE   Ketones, ur 20 (A) NEGATIVE mg/dL   Protein, ur NEGATIVE NEGATIVE mg/dL   Nitrite NEGATIVE NEGATIVE   Leukocytes,Ua NEGATIVE NEGATIVE  Wet prep, genital     Status: Abnormal   Collection Time: 03/26/20 11:16 PM  Result Value Ref Range   Yeast Wet Prep HPF POC NONE SEEN NONE SEEN   Trich, Wet Prep NONE SEEN NONE SEEN   Clue Cells Wet Prep HPF POC PRESENT (A) NONE SEEN   WBC, Wet Prep HPF POC FEW (A) NONE SEEN   Sperm NONE SEEN   CBC     Status: None   Collection Time: 03/27/20 12:13 AM  Result Value Ref Range   WBC 7.0 4.0 - 10.5 K/uL   RBC 4.10 3.87 - 5.11 MIL/uL   Hemoglobin 12.2 12.0 - 15.0 g/dL    HCT 03/29/20 36 - 46 %   MCV 90.5 80.0 - 100.0 fL   MCH 29.8 26.0 - 34.0 pg   MCHC 32.9 30.0 - 36.0 g/dL   RDW 02.6 37.8 - 58.8 %   Platelets 169 150 - 400 K/uL   nRBC 0.0 0.0 - 0.2 %  Comprehensive metabolic panel     Status: Abnormal   Collection Time: 03/27/20 12:13 AM  Result Value Ref Range   Sodium 135 135 - 145 mmol/L   Potassium 3.5 3.5 - 5.1 mmol/L   Chloride 102 98 - 111 mmol/L   CO2 21 (L) 22 -  32 mmol/L   Glucose, Bld 87 70 - 99 mg/dL   BUN 5 (L) 6 - 20 mg/dL   Creatinine, Ser 7.09 0.44 - 1.00 mg/dL   Calcium 9.5 8.9 - 62.8 mg/dL   Total Protein 7.0 6.5 - 8.1 g/dL   Albumin 4.1 3.5 - 5.0 g/dL   AST 18 15 - 41 U/L   ALT 12 0 - 44 U/L   Alkaline Phosphatase 44 38 - 126 U/L   Total Bilirubin 1.1 0.3 - 1.2 mg/dL   GFR calc non Af Amer >60 >60 mL/min   GFR calc Af Amer >60 >60 mL/min   Anion gap 12 5 - 15  hCG, quantitative, pregnancy     Status: Abnormal   Collection Time: 03/27/20 12:22 AM  Result Value Ref Range   hCG, Beta Chain, Quant, S 32,812 (H) <5 mIU/mL    IMAGING US OB LESS THAN 14 WEEKS WITH OB TRANSVAGINAL  Result Date: 03/27/2020 CLINICAL DATA:  Left lower quadrant pain EXAM: OBSTETRIC <14 WK ULTRASOUND TECHNIQUE: Transabdominal ultrasound was performed for evaluation of the gestation as well as the maternal uterus and adnexal regions. COMPARISON:  None. FINDINGS: Intrauterine gestational sac: Single Yolk sac:  Visualized. Embryo:  Not Visualized. Cardiac Activity: Not Visualized. MSD: 14.6 mm, 6 W 2D Subchorionic hemorrhage:  None visualized. Maternal uterus/adnexae: Left ovary measures 1.8 x 3.0 x 2.0 cm and the right ovary measures 2.5 x 5.3 x 2.4 cm. 1.7 cm thick walled cystic structure in the right ovary likely reflects corpus luteum cyst. There is trace free fluid in the cul-de-sac. IMPRESSION: 1. Probable early intrauterine gestational sac and yolk sac, but no fetal pole or cardiac activity yet visualized. Recommend follow-up quantitative B-HCG levels  and follow-up US in 14 days to assess viability. This recommendation follows SRU consensus guidelines: Diagnostic Criteria for Nonviable Pregnancy Early in the First Trimester. Malva Limes Med 2013; 366:2947-65. 2. Probable corpus luteum cyst within the right ovary. 3. Trace pelvic free fluid, likely physiologic. Electronically Signed   By: Sharlet Salina M.D.   On: 03/27/2020 00:54     MAU Management/MDM: Ordered usual first trimester r/o ectopic labs.   Pelvic exam and cultures done Will check baseline Ultrasound to rule out ectopic.  This bleeding/pain can represent a normal pregnancy with bleeding, spontaneous abortion or even an ectopic which can be life-threatening.  The process as listed above helps to determine which of these is present.  Hydrated with IV fluids and gave Phenergan for nausea Reviewed findings of labs and ultrasound Presence of yolk sac effectively rules out ectopic pregnnacy.  Discussed we cannot see embryo yet.  Encouraged to start prenatal care  ASSESSMENT Pregnancy at [redacted]w[redacted]d by LMP Nausea and vomiting Pelvic pain in early pregnancy Spotting in early pregnancy   PLAN Discharge home Rx Diclegis for nausea Encouraged to start prenatal care Pt stable at time of discharge. Encouraged to return here or to other Urgent Care/ED if she develops worsening of symptoms, increase in pain, fever, or other concerning symptoms.    Wynelle Bourgeois CNM, MSN Certified Nurse-Midwife 03/26/2020  11:12 PM

## 2020-03-27 ENCOUNTER — Inpatient Hospital Stay (HOSPITAL_COMMUNITY): Payer: Medicaid Other

## 2020-03-27 LAB — CBC
HCT: 37.1 % (ref 36.0–46.0)
Hemoglobin: 12.2 g/dL (ref 12.0–15.0)
MCH: 29.8 pg (ref 26.0–34.0)
MCHC: 32.9 g/dL (ref 30.0–36.0)
MCV: 90.5 fL (ref 80.0–100.0)
Platelets: 169 10*3/uL (ref 150–400)
RBC: 4.1 MIL/uL (ref 3.87–5.11)
RDW: 14.1 % (ref 11.5–15.5)
WBC: 7 10*3/uL (ref 4.0–10.5)
nRBC: 0 % (ref 0.0–0.2)

## 2020-03-27 LAB — COMPREHENSIVE METABOLIC PANEL
ALT: 12 U/L (ref 0–44)
AST: 18 U/L (ref 15–41)
Albumin: 4.1 g/dL (ref 3.5–5.0)
Alkaline Phosphatase: 44 U/L (ref 38–126)
Anion gap: 12 (ref 5–15)
BUN: 5 mg/dL — ABNORMAL LOW (ref 6–20)
CO2: 21 mmol/L — ABNORMAL LOW (ref 22–32)
Calcium: 9.5 mg/dL (ref 8.9–10.3)
Chloride: 102 mmol/L (ref 98–111)
Creatinine, Ser: 0.48 mg/dL (ref 0.44–1.00)
GFR calc Af Amer: >60 mL/min (ref >60)
GFR calc non Af Amer: >60 mL/min (ref >60)
Glucose, Bld: 87 mg/dL (ref 70–99)
Potassium: 3.5 mmol/L (ref 3.5–5.1)
Sodium: 135 mmol/L (ref 135–145)
Total Bilirubin: 1.1 mg/dL (ref 0.3–1.2)
Total Protein: 7 g/dL (ref 6.5–8.1)

## 2020-03-27 LAB — GC/CHLAMYDIA PROBE AMP (~~LOC~~) NOT AT ARMC
Chlamydia: NEGATIVE
Comment: NEGATIVE
Comment: NORMAL
Neisseria Gonorrhea: NEGATIVE

## 2020-03-27 LAB — HCG, QUANTITATIVE, PREGNANCY: hCG, Beta Chain, Quant, S: 32812 m[IU]/mL — ABNORMAL HIGH (ref <5)

## 2020-03-27 MED ORDER — DOXYLAMINE-PYRIDOXINE 10-10 MG PO TBEC: 2.0000 | DELAYED_RELEASE_TABLET | Freq: Every evening | ORAL | 1 refills | Status: DC | PRN

## 2020-03-27 NOTE — Progress Notes (Signed)
Marie Williams CNM in earlier to discuss test results and d/c plan. Written and verbal d/c instructions given and understanding voiced °

## 2020-03-27 NOTE — Discharge Instructions (Signed)
Vaginal Bleeding During Pregnancy, First Trimester  A small amount of bleeding (spotting) from the vagina is common during early pregnancy. Sometimes the bleeding is normal and does not cause problems. At other times, though, bleeding may be a sign of something serious. Tell your doctor about any bleeding from your vagina right away. Follow these instructions at home: Activity  Follow your doctor's instructions about how active you can be.  If needed, make plans for someone to help with your normal activities.  Do not have sex or orgasms until your doctor says that this is safe. General instructions  Take over-the-counter and prescription medicines only as told by your doctor.  Watch your condition for any changes.  Write down: ? The number of pads you use each day. ? How often you change pads. ? How soaked (saturated) your pads are.  Do not use tampons.  Do not douche.  If you pass any tissue from your vagina, save it to show to your doctor.  Keep all follow-up visits as told by your doctor. This is important. Contact a doctor if:  You have vaginal bleeding at any time while you are pregnant.  You have cramps.  You have a fever. Get help right away if:  You have very bad cramps in your back or belly (abdomen).  You pass large clots or a lot of tissue from your vagina.  Your bleeding gets worse.  You feel light-headed.  You feel weak.  You pass out (faint).  You have chills.  You are leaking fluid from your vagina.  You have a gush of fluid from your vagina. Summary  Sometimes vaginal bleeding during pregnancy is normal and does not cause problems. At other times, bleeding may be a sign of something serious.  Tell your doctor about any bleeding from your vagina right away.  Follow your doctor's instructions about how active you can be. You may need someone to help you with your normal activities. This information is not intended to replace advice given to  you by your health care provider. Make sure you discuss any questions you have with your health care provider. Document Revised: 12/11/2018 Document Reviewed: 11/23/2016 Elsevier Patient Education  2020 Elsevier [employer]. [facility] Providers    Center for [employer] [facility] at [facility]       Phone: [phone]  Center for [facility] at [place]   Phone: [phone]  Center for [employer] at [place]  Phone: [phone]  Center for [facility] at [facility]  Phone: [phone]  Center for [facility] at [place]  Phone: [phone]  [facility] for [facility] at [facility]   Phone: [phone]  [facility]       Phone: [phone]  [facility]    Phone: [phone]   [facility]    Phone: [phone]  [facility]    Phone: [phone]  [facility]    Phone: [phone]  [facility]       Phone: [phone]   [facility] Department-Maternity  Phone: [phone]  [facility]    Phone: [phone]  [facility]   Phone: [phone]  [facility]      Phone: [phone]  [facility]    Phone: [phone]

## 2020-04-01 ENCOUNTER — Inpatient Hospital Stay (HOSPITAL_COMMUNITY)
Admission: AD | Admit: 2020-04-01 | Discharge: 2020-04-01 | Discharge status: Against Medical Advice | Payer: Medicaid Other | Attending: Obstetrics and Gynecology | Admitting: Obstetrics and Gynecology

## 2020-04-01 ENCOUNTER — Other Ambulatory Visit: Payer: Self-pay

## 2020-04-01 ENCOUNTER — Encounter (HOSPITAL_COMMUNITY): Payer: Self-pay | Admitting: Obstetrics and Gynecology

## 2020-04-01 DIAGNOSIS — Z3A01 Less than 8 weeks gestation of pregnancy: Secondary | ICD-10-CM | POA: Insufficient documentation

## 2020-04-01 DIAGNOSIS — Z79899 Other long term (current) drug therapy: Secondary | ICD-10-CM | POA: Diagnosis not present

## 2020-04-01 DIAGNOSIS — Z5329 Procedure and treatment not carried out because of patient's decision for other reasons: Secondary | ICD-10-CM | POA: Insufficient documentation

## 2020-04-01 DIAGNOSIS — Z87442 Personal history of urinary calculi: Secondary | ICD-10-CM | POA: Diagnosis not present

## 2020-04-01 DIAGNOSIS — Z87891 Personal history of nicotine dependence: Secondary | ICD-10-CM | POA: Diagnosis not present

## 2020-04-01 DIAGNOSIS — O4691 Antepartum hemorrhage, unspecified, first trimester: Secondary | ICD-10-CM | POA: Diagnosis not present

## 2020-04-01 LAB — URINALYSIS, ROUTINE W REFLEX MICROSCOPIC
Bilirubin Urine: NEGATIVE
Glucose, UA: NEGATIVE mg/dL
Hgb urine dipstick: NEGATIVE
Ketones, ur: NEGATIVE mg/dL
Leukocytes,Ua: NEGATIVE
Nitrite: NEGATIVE
Protein, ur: NEGATIVE mg/dL
Specific Gravity, Urine: 1.021 (ref 1.005–1.030)
pH: 6 (ref 5.0–8.0)

## 2020-04-01 LAB — WET PREP, GENITAL
Clue Cells Wet Prep HPF POC: NONE SEEN
Trich, Wet Prep: NONE SEEN
Yeast Wet Prep HPF POC: NONE SEEN

## 2020-04-01 NOTE — MAU Provider Note (Addendum)
Patient Joanne Aguilar is a 24 y.o. G2P1001  at 110w4d here with complaints of vaginal bleeding this morning at 10 am. Her abdomen "feels weird", but denies pain. She had intercourse last night around 9 pm. She denies nausea, vomiting, dysuria. She has an IUP, confirmed by [place] on 03/31/2020.    History     CSN: [id number]  Arrival date and time: 04/01/20 1123   First Provider Initiated Contact with Patient 04/01/20 1244      Chief Complaint  Patient presents with  . Vaginal Bleeding   Vaginal Bleeding The patient's primary symptoms include vaginal bleeding. This is a new problem. The current episode started today. The problem occurs rarely. The problem has been resolved. The patient is experiencing no pain. The vaginal discharge was bloody. The vaginal bleeding is spotting. She has not been passing clots. She has not been passing tissue.    OB History    Gravida  2   Para  1   Term  1   Preterm      AB      Living  1     SAB      TAB      Ectopic      Multiple  0   Live Births  1           Past Medical History:  Diagnosis Date  . Anemia   . Kidney stones     Past Surgical History:  Procedure Laterality Date  . TOOTH EXTRACTION      Family History  Problem Relation Age of Onset  . Hypertension Maternal Grandmother     Social History   Tobacco Use  . Smoking status: Former Smoker    Types: Cigarettes  . Smokeless tobacco: Never Used  Substance Use Topics  . Alcohol use: Yes    Comment: socially  . Drug use: Yes    Types: Marijuana    Comment: None since before pregnancy    Allergies: No Known Allergies  Medications Prior to Admission  Medication Sig Dispense Refill Last Dose  . Doxylamine-Pyridoxine (DICLEGIS) 10-10 MG TBEC Take 2 tablets by mouth at bedtime as needed. 60 tablet 1 03/31/2020 at Unknown time  . Prenatal Vit-Fe Fumarate-FA (PRENATAL MULTIVITAMIN) TABS tablet Take 1 tablet by mouth daily at 12 noon.       Review of Systems   Constitutional: Negative.   HENT: Negative.   Respiratory: Negative.   Cardiovascular: Negative.   Gastrointestinal: Negative.   Genitourinary: Positive for vaginal bleeding.  Musculoskeletal: Negative.   Neurological: Negative.    Physical Exam   Blood pressure 128/69, pulse 78, temperature 98.2 F (36.8 C), resp. rate 18, weight 68 kg, last menstrual period 02/15/2020, SpO2 100 %.  Physical Exam HENT:     Head: Normocephalic.  Genitourinary:    General: Normal vulva.     Vagina: No vaginal discharge.     Comments: NEFG; no blood in vagina, no discharge, lesions, cervix is long, closed,no CMT, suprapubic or adnexal tenderness.  Skin:    General: Skin is warm.  Neurological:     General: No focal deficit present.     Mental Status: She is alert.     MAU Course  Procedures  MDM -wet prep is negative for signs of infection -GC CT collected -UA negative   Speculum exam reassuring,  At 1410 I came into room to give patient results and perform bedside US and room was empty with gown on the counter.  Assume  patient left AMA.   Assessment and Plan    Charlesetta Garibaldi [facility] 04/01/2020, 12:50 PM

## 2020-04-01 NOTE — MAU Note (Signed)
Pt left without results were resulted by lab. Did not tell staff she was leaving

## 2020-04-01 NOTE — MAU Note (Signed)
.   Joanne Aguilar is a 24 y.o. at [redacted]w[redacted]d here in MAU reporting: vaginal bleeding after intercourse last night, she noticed the bleeding this morning. Denies any bleeding at this time. Denies any pain LMP: 02/15/20 Onset of complaint: this morning Pain score: 0 Vitals:   04/01/20 1207  BP: 128/69  Pulse: 78  Resp: 18  Temp: 98.2 F (36.8 C)  SpO2: 100%     FHT: Lab orders placed from triage: UA

## 2020-04-02 LAB — GC/CHLAMYDIA PROBE AMP (~~LOC~~) NOT AT ARMC
Chlamydia: NEGATIVE
Comment: NEGATIVE
Comment: NORMAL
Neisseria Gonorrhea: NEGATIVE

## 2020-04-22 ENCOUNTER — Encounter (HOSPITAL_COMMUNITY): Payer: Self-pay | Admitting: Obstetrics and Gynecology

## 2020-04-22 ENCOUNTER — Inpatient Hospital Stay (HOSPITAL_COMMUNITY)
Admission: AD | Admit: 2020-04-22 | Discharge: 2020-04-22 | Disposition: A | Discharge status: Home / Self Care | Payer: Medicaid Other | Attending: Obstetrics and Gynecology | Admitting: Obstetrics and Gynecology

## 2020-04-22 ENCOUNTER — Other Ambulatory Visit: Payer: Self-pay

## 2020-04-22 DIAGNOSIS — O26891 Other specified pregnancy related conditions, first trimester: Secondary | ICD-10-CM | POA: Diagnosis not present

## 2020-04-22 DIAGNOSIS — Z79899 Other long term (current) drug therapy: Secondary | ICD-10-CM | POA: Insufficient documentation

## 2020-04-22 DIAGNOSIS — Z87442 Personal history of urinary calculi: Secondary | ICD-10-CM | POA: Insufficient documentation

## 2020-04-22 DIAGNOSIS — R0602 Shortness of breath: Secondary | ICD-10-CM | POA: Diagnosis not present

## 2020-04-22 DIAGNOSIS — O99281 Endocrine, nutritional and metabolic diseases complicating pregnancy, first trimester: Secondary | ICD-10-CM | POA: Insufficient documentation

## 2020-04-22 DIAGNOSIS — O211 Hyperemesis gravidarum with metabolic disturbance: Secondary | ICD-10-CM | POA: Diagnosis not present

## 2020-04-22 DIAGNOSIS — O99331 Smoking (tobacco) complicating pregnancy, first trimester: Secondary | ICD-10-CM | POA: Insufficient documentation

## 2020-04-22 DIAGNOSIS — Z3A09 9 weeks gestation of pregnancy: Secondary | ICD-10-CM | POA: Insufficient documentation

## 2020-04-22 DIAGNOSIS — F17210 Nicotine dependence, cigarettes, uncomplicated: Secondary | ICD-10-CM | POA: Diagnosis not present

## 2020-04-22 DIAGNOSIS — E860 Dehydration: Secondary | ICD-10-CM | POA: Insufficient documentation

## 2020-04-22 DIAGNOSIS — O219 Vomiting of pregnancy, unspecified: Secondary | ICD-10-CM | POA: Diagnosis present

## 2020-04-22 LAB — URINALYSIS, ROUTINE W REFLEX MICROSCOPIC
Bilirubin Urine: NEGATIVE
Glucose, UA: NEGATIVE mg/dL
Hgb urine dipstick: NEGATIVE
Ketones, ur: 80 mg/dL — AB
Leukocytes,Ua: NEGATIVE
Nitrite: NEGATIVE
Protein, ur: 100 mg/dL — AB
Specific Gravity, Urine: 1.029 (ref 1.005–1.030)
pH: 6 (ref 5.0–8.0)

## 2020-04-22 LAB — CBC WITH DIFFERENTIAL/PLATELET
Abs Immature Granulocytes: 0.02 10*3/uL (ref 0.00–0.07)
Basophils Absolute: 0.1 10*3/uL (ref 0.0–0.1)
Basophils Relative: 1 %
Eosinophils Absolute: 0.1 10*3/uL (ref 0.0–0.5)
Eosinophils Relative: 1 %
HCT: 39.7 % (ref 36.0–46.0)
Hemoglobin: 13.5 g/dL (ref 12.0–15.0)
Immature Granulocytes: 0 %
Lymphocytes Relative: 34 %
Lymphs Abs: 2.4 10*3/uL (ref 0.7–4.0)
MCH: 30 pg (ref 26.0–34.0)
MCHC: 34 g/dL (ref 30.0–36.0)
MCV: 88.2 fL (ref 80.0–100.0)
Monocytes Absolute: 0.7 10*3/uL (ref 0.1–1.0)
Monocytes Relative: 9 %
Neutro Abs: 4 10*3/uL (ref 1.7–7.7)
Neutrophils Relative %: 55 %
Platelets: 204 10*3/uL (ref 150–400)
RBC: 4.5 MIL/uL (ref 3.87–5.11)
RDW: 13.2 % (ref 11.5–15.5)
WBC: 7.3 10*3/uL (ref 4.0–10.5)
nRBC: 0 % (ref 0.0–0.2)

## 2020-04-22 LAB — COMPREHENSIVE METABOLIC PANEL
ALT: 14 U/L (ref 0–44)
AST: 18 U/L (ref 15–41)
Albumin: 4.3 g/dL (ref 3.5–5.0)
Alkaline Phosphatase: 40 U/L (ref 38–126)
Anion gap: 11 (ref 5–15)
BUN: 5 mg/dL — ABNORMAL LOW (ref 6–20)
CO2: 21 mmol/L — ABNORMAL LOW (ref 22–32)
Calcium: 10.6 mg/dL — ABNORMAL HIGH (ref 8.9–10.3)
Chloride: 101 mmol/L (ref 98–111)
Creatinine, Ser: 0.51 mg/dL (ref 0.44–1.00)
GFR calc Af Amer: >60 mL/min (ref >60)
GFR calc non Af Amer: >60 mL/min (ref >60)
Glucose, Bld: 85 mg/dL (ref 70–99)
Potassium: 4 mmol/L (ref 3.5–5.1)
Sodium: 133 mmol/L — ABNORMAL LOW (ref 135–145)
Total Bilirubin: 1.4 mg/dL — ABNORMAL HIGH (ref 0.3–1.2)
Total Protein: 7.5 g/dL (ref 6.5–8.1)

## 2020-04-22 MED ORDER — ONDANSETRON HCL 4 MG/2ML IJ SOLN
4.0000 mg | Freq: Once | INTRAMUSCULAR | Status: AC
  Administered 2020-04-22: 4 mg via INTRAVENOUS
  Filled 2020-04-22: qty 2

## 2020-04-22 MED ORDER — LACTATED RINGERS IV BOLUS
1000.0000 mL | Freq: Once | INTRAVENOUS | Status: AC
  Administered 2020-04-22: 1000 mL via INTRAVENOUS

## 2020-04-22 MED ORDER — M.V.I. ADULT IV INJ
Freq: Once | INTRAVENOUS | Status: AC
  Filled 2020-04-22: qty 10

## 2020-04-22 MED ORDER — FAMOTIDINE IN NACL 20-0.9 MG/50ML-% IV SOLN
20.0000 mg | Freq: Once | INTRAVENOUS | Status: AC
  Administered 2020-04-22: 20 mg via INTRAVENOUS
  Filled 2020-04-22: qty 50

## 2020-04-22 MED ORDER — ONDANSETRON 4 MG PO TBDP: 4.0000 mg | ORAL_TABLET | Freq: Three times a day (TID) | ORAL | 2 refills | Status: DC | PRN

## 2020-04-22 NOTE — MAU Provider Note (Signed)
History     CSN: [id number]  Arrival date and time: 04/22/20 1429   First Provider Initiated Contact with Patient 04/22/20 1757      Chief Complaint  Patient presents with  . Emesis  . Shortness of Breath   Joanne Aguilar is a 24 y.o. G2P1 at [redacted]w[redacted]d by LMP who presents to MAU with complaints of nausea/vomiting, fatigue, SOB and elevated HR. Patient reports nausea and vomiting has been occurring for the past few weeks which she has been seen multiple times in ED and MAU. She was taking diclegis and phenergan but unable to keep medication down. Patient reports being unable to eat or drink anything today. She reports elevated HR and SOB with activity over the past week. Patient reports that she is "just exhausted and tired". She denies vaginal bleeding, discharge, or abdominal pain.    OB History    Gravida  2   Para  1   Term  1   Preterm      AB      Living  1     SAB      TAB      Ectopic      Multiple  0   Live Births  1           Past Medical History:  Diagnosis Date  . Anemia   . Kidney stones     Past Surgical History:  Procedure Laterality Date  . TOOTH EXTRACTION      Family History  Problem Relation Age of Onset  . Hypertension Mother   . Hypertension Maternal Grandmother     Social History   Tobacco Use  . Smoking status: Current Some Day Smoker    Types: Cigarettes  . Smokeless tobacco: Never Used  . Tobacco comment: 1 cigarettes  Substance Use Topics  . Alcohol use: Not Currently    Comment: socially  . Drug use: Not Currently    Types: Marijuana    Comment: None since before pregnancy    Allergies: No Known Allergies  Medications Prior to Admission  Medication Sig Dispense Refill Last Dose  . Doxylamine-Pyridoxine (DICLEGIS) 10-10 MG TBEC Take 2 tablets by mouth at bedtime as needed. 60 tablet 1   . Prenatal Vit-Fe Fumarate-FA (PRENATAL MULTIVITAMIN) TABS tablet Take 1 tablet by mouth daily at 12 noon.       Review of  Systems  Constitutional: Positive for fatigue. Negative for chills and fever.  Respiratory: Positive for shortness of breath. Negative for cough and wheezing.   Cardiovascular: Negative.   Gastrointestinal: Positive for nausea and vomiting. Negative for abdominal pain, constipation and diarrhea.  Genitourinary: Negative.   Musculoskeletal: Negative.   Neurological: Negative.   Psychiatric/Behavioral: Negative.    Physical Exam   Blood pressure 124/81, pulse 92, temperature 98.5 F (36.9 C), temperature source Oral, resp. rate 15, height 5\' 3"  (1.6 m), weight 64.6 kg, last menstrual period 02/15/2020, SpO2 100 %.  Physical Exam Vitals and nursing note reviewed.  HENT:     Head: Normocephalic.  Cardiovascular:     Rate and Rhythm: Normal rate and regular rhythm.  Pulmonary:     Effort: Pulmonary effort is normal. No respiratory distress.     Breath sounds: Normal breath sounds. No decreased breath sounds or wheezing.  Abdominal:     General: There is no distension.     Palpations: Abdomen is soft. There is no mass.     Tenderness: There is no abdominal tenderness. There is no  guarding.  Skin:    General: Skin is warm and dry.     Coloration: Skin is pale.  Neurological:     Mental Status: She is alert and oriented to person, place, and time.  Psychiatric:        Mood and Affect: Mood normal.        Behavior: Behavior normal.        Thought Content: Thought content normal.    MAU Course  Procedures  MDM ED EKG - NSR, Normal ECG  Pulse 92 , O2 sats 100% upon arrival   Orders Placed This Encounter  Procedures  . Urinalysis, Routine w reflex microscopic Urine, Clean Catch  . CBC with Differential/Platelet  . Comprehensive metabolic panel  . ED EKG  . Insert peripheral IV   Meds ordered this encounter  Medications  . lactated ringers bolus 1,000 mL  . ondansetron (ZOFRAN) injection 4 mg  . multivitamins adult (INFUVITE ADULT) 10 mL in lactated ringers 1,000 mL  infusion  . famotidine (PEPCID) IVPB 20 mg premix   Labs reviewed:  Results for orders placed or performed during the hospital encounter of 04/22/20 (from the past 24 hour(s))  Urinalysis, Routine w reflex microscopic Urine, Clean Catch     Status: Abnormal   Collection Time: 04/22/20  3:39 PM  Result Value Ref Range   Color, Urine AMBER (A) YELLOW   APPearance HAZY (A) CLEAR   Specific Gravity, Urine 1.029 1.005 - 1.030   pH 6.0 5.0 - 8.0   Glucose, UA NEGATIVE NEGATIVE mg/dL   Hgb urine dipstick NEGATIVE NEGATIVE   Bilirubin Urine NEGATIVE NEGATIVE   Ketones, ur 80 (A) NEGATIVE mg/dL   Protein, ur 151 (A) NEGATIVE mg/dL   Nitrite NEGATIVE NEGATIVE   Leukocytes,Ua NEGATIVE NEGATIVE   RBC / HPF 0-5 0 - 5 RBC/hpf   WBC, UA 0-5 0 - 5 WBC/hpf   Bacteria, UA RARE (A) NONE SEEN   Squamous Epithelial / LPF 6-10 0 - 5   Mucus PRESENT    Hyaline Casts, UA PRESENT   CBC with Differential/Platelet     Status: None   Collection Time: 04/22/20  6:14 PM  Result Value Ref Range   WBC 7.3 4.0 - 10.5 K/uL   RBC 4.50 3.87 - 5.11 MIL/uL   Hemoglobin 13.5 12.0 - 15.0 g/dL   HCT 76.1 36 - 46 %   MCV 88.2 80.0 - 100.0 fL   MCH 30.0 26.0 - 34.0 pg   MCHC 34.0 30.0 - 36.0 g/dL   RDW 60.7 37.1 - 06.2 %   Platelets 204 150 - 400 K/uL   nRBC 0.0 0.0 - 0.2 %   Neutrophils Relative % 55 %   Neutro Abs 4.0 1.7 - 7.7 K/uL   Lymphocytes Relative 34 %   Lymphs Abs 2.4 0.7 - 4.0 K/uL   Monocytes Relative 9 %   Monocytes Absolute 0.7 0 - 1 K/uL   Eosinophils Relative 1 %   Eosinophils Absolute 0.1 0 - 0 K/uL   Basophils Relative 1 %   Basophils Absolute 0.1 0 - 0 K/uL   Immature Granulocytes 0 %   Abs Immature Granulocytes 0.02 0.00 - 0.07 K/uL  Comprehensive metabolic panel     Status: Abnormal   Collection Time: 04/22/20  6:14 PM  Result Value Ref Range   Sodium 133 (L) 135 - 145 mmol/L   Potassium 4.0 3.5 - 5.1 mmol/L   Chloride 101 98 - 111 mmol/L   CO2  21 (L) 22 - 32 mmol/L   Glucose, Bld  85 70 - 99 mg/dL   BUN 5 (L) 6 - 20 mg/dL   Creatinine, Ser 2.35 0.44 - 1.00 mg/dL   Calcium 57.3 (H) 8.9 - 10.3 mg/dL   Total Protein 7.5 6.5 - 8.1 g/dL   Albumin 4.3 3.5 - 5.0 g/dL   AST 18 15 - 41 U/L   ALT 14 0 - 44 U/L   Alkaline Phosphatase 40 38 - 126 U/L   Total Bilirubin 1.4 (H) 0.3 - 1.2 mg/dL   GFR calc non Af Amer >60 >60 mL/min   GFR calc Af Amer >60 >60 mL/min   Anion gap 11 5 - 15   Treatments in MAU included LR bolus, zofran IV, pepcid and MVI.  Patient able to tolerate PO challenge after completion. Patient denies SOB, fatigue after completion of medication.   Bedside US performed  Pt informed that the ultrasound is considered a limited OB ultrasound and is not intended to be a complete ultrasound exam.  Patient also informed that the ultrasound is not being completed with the intent of assessing for fetal or placental anomalies or any pelvic abnormalities.  Explained that the purpose of today's ultrasound is to assess for  viability.  Patient acknowledges the purpose of the exam and the limitations of the study.   FHR noted on bedside US   Discussed reasons to return to MAU. Follow up as scheduled in the office. Return to MAU as needed. Pt stable at time of discharge. Rx for zofran sent to pharmacy of choice.   Assessment and Plan   1. Nausea and vomiting in pregnancy   2. [redacted] weeks gestation of pregnancy   3. Dehydration during pregnancy    Discharge home Follow up as scheduled in the office for prenatal care Return to MAU as needed for reasons discussed and/or emergencies  Rx for zofran    Follow-up Information    [facility] Follow up.   Specialty: Obstetrics and Gynecology Why: Return to MAU as needed for emergencies  Contact information: [address] [id number] mc [place] [address] [phone]             Allergies as of 04/22/2020   No Known Allergies     Medication List    STOP taking these  medications   Doxylamine-Pyridoxine 10-10 MG Tbec Commonly known as: Diclegis     TAKE these medications   ondansetron 4 MG disintegrating tablet Commonly known as: Zofran ODT Take 1 tablet (4 mg total) by mouth every 8 (eight) hours as needed for nausea or vomiting.   prenatal multivitamin Tabs tablet Take 1 tablet by mouth daily at 12 noon.       Sharyon Cable CNM 04/22/2020, 8:34 PM

## 2020-04-22 NOTE — MAU Note (Signed)
Been happening the past wk, with any activity she gets short of breath and her heart will rate.  Is exhausted, can't keep anything down, even water, some times "is just air or bubbles".  Has medicine for nausea but can't keep it down. ( apparent distress when walked from lobby into triage, pt moved mask down, no flaring, no use of accessory muscles, pt reported above without need to pause, o2 sat remained at 100 and pulse in 90's while talking.

## 2020-08-24 ENCOUNTER — Emergency Department (HOSPITAL_COMMUNITY)
Admission: EM | Admit: 2020-08-24 | Discharge: 2020-08-24 | Disposition: A | Discharge status: Home / Self Care | Payer: Medicaid Other | Attending: Emergency Medicine | Admitting: Emergency Medicine

## 2020-08-24 ENCOUNTER — Other Ambulatory Visit: Payer: Self-pay

## 2020-08-24 DIAGNOSIS — R109 Unspecified abdominal pain: Secondary | ICD-10-CM | POA: Insufficient documentation

## 2020-08-24 DIAGNOSIS — Z5321 Procedure and treatment not carried out due to patient leaving prior to being seen by health care provider: Secondary | ICD-10-CM | POA: Insufficient documentation

## 2020-08-24 LAB — COMPREHENSIVE METABOLIC PANEL
ALT: 10 U/L (ref 0–44)
AST: 16 U/L (ref 15–41)
Albumin: 3.6 g/dL (ref 3.5–5.0)
Alkaline Phosphatase: 50 U/L (ref 38–126)
Anion gap: 11 (ref 5–15)
BUN: <5 mg/dL — ABNORMAL LOW (ref 6–20)
CO2: 21 mmol/L — ABNORMAL LOW (ref 22–32)
Calcium: 8.7 mg/dL — ABNORMAL LOW (ref 8.9–10.3)
Chloride: 105 mmol/L (ref 98–111)
Creatinine, Ser: 0.82 mg/dL (ref 0.44–1.00)
GFR, Estimated: >60 mL/min (ref >60)
Glucose, Bld: 89 mg/dL (ref 70–99)
Potassium: 3.6 mmol/L (ref 3.5–5.1)
Sodium: 137 mmol/L (ref 135–145)
Total Bilirubin: 0.3 mg/dL (ref 0.3–1.2)
Total Protein: 6.1 g/dL — ABNORMAL LOW (ref 6.5–8.1)

## 2020-08-24 LAB — CBC
HCT: 37.3 % (ref 36.0–46.0)
Hemoglobin: 11.8 g/dL — ABNORMAL LOW (ref 12.0–15.0)
MCH: 29.4 pg (ref 26.0–34.0)
MCHC: 31.6 g/dL (ref 30.0–36.0)
MCV: 93 fL (ref 80.0–100.0)
Platelets: 134 10*3/uL — ABNORMAL LOW (ref 150–400)
RBC: 4.01 MIL/uL (ref 3.87–5.11)
RDW: 14.9 % (ref 11.5–15.5)
WBC: 6.1 10*3/uL (ref 4.0–10.5)
nRBC: 0 % (ref 0.0–0.2)

## 2020-08-24 LAB — LIPASE, BLOOD: Lipase: 39 U/L (ref 11–51)

## 2020-08-24 LAB — I-STAT BETA HCG BLOOD, ED (MC, WL, AP ONLY): I-stat hCG, quantitative: <5 m[IU]/mL (ref <5)

## 2020-08-24 NOTE — ED Notes (Signed)
Called patient, unable to locate at this time. 

## 2020-08-24 NOTE — ED Notes (Signed)
Called patient to move to room, unable to locate patient at this time.

## 2020-08-24 NOTE — ED Triage Notes (Signed)
C/O abdominal pain x 3 days;

## 2020-08-31 ENCOUNTER — Ambulatory Visit
Admission: EM | Admit: 2020-08-31 | Discharge: 2020-08-31 | Disposition: A | Discharge status: Home / Self Care | Payer: Medicaid Other | Attending: Emergency Medicine | Admitting: Emergency Medicine

## 2020-08-31 ENCOUNTER — Other Ambulatory Visit: Payer: Self-pay

## 2020-08-31 DIAGNOSIS — N730 Acute parametritis and pelvic cellulitis: Secondary | ICD-10-CM

## 2020-08-31 LAB — POCT URINALYSIS DIP (MANUAL ENTRY)
Bilirubin, UA: NEGATIVE
Glucose, UA: NEGATIVE mg/dL
Nitrite, UA: NEGATIVE
Protein Ur, POC: NEGATIVE mg/dL
Spec Grav, UA: 1.025 (ref 1.010–1.025)
Urobilinogen, UA: 1 E.U./dL
pH, UA: 6.5 (ref 5.0–8.0)

## 2020-08-31 LAB — POCT URINE PREGNANCY: Preg Test, Ur: NEGATIVE

## 2020-08-31 MED ORDER — CEFTRIAXONE SODIUM 1 G IJ SOLR
1.0000 g | Freq: Once | INTRAMUSCULAR | Status: AC
  Administered 2020-08-31: 12:00:00 1 g via INTRAMUSCULAR

## 2020-08-31 MED ORDER — DOXYCYCLINE HYCLATE 100 MG PO CAPS: 100.0000 mg | ORAL_CAPSULE | Freq: Two times a day (BID) | ORAL | 0 refills | Status: AC

## 2020-08-31 MED ORDER — METRONIDAZOLE 500 MG PO TABS: 500.0000 mg | ORAL_TABLET | Freq: Two times a day (BID) | ORAL | 0 refills | Status: AC

## 2020-08-31 NOTE — ED Provider Notes (Signed)
[name]    CSN: [id number] Arrival date & time: 08/31/20  0934      History   Chief Complaint Chief Complaint  Patient presents with  . Abdominal Pain    Since yesterday    HPI Joanne Aguilar is a 24 y.o. female  Presenting for vaginal discharge and pelvic pain since last night.  Patient states discharge is without smell, though green in appearance.  Denies known exposure to STI, history of PID.  Past Medical History:  Diagnosis Date  . Anemia   . Kidney stones     Patient Active Problem List   Diagnosis Date Noted  . UTI (urinary tract infection) 03/03/2017  . Breast pain in female 03/03/2017  . Cervical lesion 03/03/2017  . Nexplanon removal 01/12/2017  . Vaginal discharge 12/20/2016  . Depression 12/20/2016  . STD exposure 12/20/2016  . NSVD (normal spontaneous vaginal delivery) 05/09/2015    Past Surgical History:  Procedure Laterality Date  . TOOTH EXTRACTION      OB History    Gravida  2   Para  1   Term  1   Preterm      AB      Living  1     SAB      IAB      Ectopic      Multiple  0   Live Births  1            Home Medications    Prior to Admission medications   Medication Sig Start Date End Date Taking? Authorizing Provider  doxycycline (VIBRAMYCIN) 100 MG capsule Take 1 capsule (100 mg total) by mouth 2 (two) times daily for 14 days. 08/31/20 09/14/20 Yes Hall-Potvin, [place], PA-C  metroNIDAZOLE (FLAGYL) 500 MG tablet Take 1 tablet (500 mg total) by mouth 2 (two) times daily for 14 days. 08/31/20 09/14/20 Yes Hall-Potvin, [place], PA-C  ondansetron (ZOFRAN ODT) 4 MG disintegrating tablet Take 1 tablet (4 mg total) by mouth every 8 (eight) hours as needed for nausea or vomiting. 04/22/20  Yes Sharyon Cable, CNM  Prenatal Vit-Fe Fumarate-FA (PRENATAL MULTIVITAMIN) TABS tablet Take 1 tablet by mouth daily at 12 noon.    [provider]    Family History Family History  Problem Relation Age  of Onset  . Hypertension Mother   . Hypertension Maternal Grandmother     Social History Social History   Tobacco Use  . Smoking status: Current Some Day Smoker    Types: Cigarettes  . Smokeless tobacco: Never Used  . Tobacco comment: 1 cigarettes  Vaping Use  . Vaping Use: Never used  Substance Use Topics  . Alcohol use: Not Currently    Comment: socially  . Drug use: Not Currently    Types: Marijuana    Comment: None since before pregnancy     Allergies   Patient has no known allergies.   Review of Systems Review of Systems  Constitutional: Negative for fatigue and fever.  Respiratory: Negative for cough and shortness of breath.   Cardiovascular: Negative for chest pain and palpitations.  Gastrointestinal: Negative for constipation and diarrhea.  Genitourinary: Positive for pelvic pain and vaginal discharge. Negative for dysuria, flank pain, frequency, hematuria, urgency, vaginal bleeding and vaginal pain.     Physical Exam Triage Vital Signs ED Triage Vitals  Enc Vitals Group     BP 08/31/20 1050 130/84     Pulse Rate 08/31/20 1050 89     Resp 08/31/20 1050  20     Temp 08/31/20 1050 98.8 F (37.1 C)     Temp Source 08/31/20 1050 Oral     SpO2 08/31/20 1050 98 %     Weight --      Height --      Head Circumference --      Peak Flow --      Pain Score 08/31/20 1058 10     Pain Loc --      Pain Edu? --      Excl. in GC? --    No data found.  Updated Vital Signs BP 130/84 (BP Location: Left Arm)   Pulse 89   Temp 98.8 F (37.1 C) (Oral)   Resp 20   LMP 02/15/2020   SpO2 98%   Breastfeeding Unknown   Visual Acuity Right Eye Distance:   Left Eye Distance:   Bilateral Distance:    Right Eye Near:   Left Eye Near:    Bilateral Near:     Physical Exam Constitutional:      General: She is not in acute distress. HENT:     Head: Normocephalic and atraumatic.  Eyes:     General: No scleral icterus.    Pupils: Pupils are equal, round, and  reactive to light.  Cardiovascular:     Rate and Rhythm: Normal rate.  Pulmonary:     Effort: Pulmonary effort is normal.  Abdominal:     General: Bowel sounds are normal.     Palpations: Abdomen is soft.     Tenderness: There is no abdominal tenderness. There is no right CVA tenderness, left CVA tenderness or guarding.  Genitourinary:    Vagina: Vaginal discharge and tenderness present.     Cervix: Cervical motion tenderness present. No friability.     Uterus: Normal.      Adnexa: Right adnexa normal and left adnexa normal.  Skin:    Coloration: Skin is not jaundiced or pale.  Neurological:     Mental Status: She is alert and oriented to person, place, and time.      UC Treatments / Results  Labs (all labs ordered are listed, but only abnormal results are displayed) Labs Reviewed  POCT URINALYSIS DIP (MANUAL ENTRY) - Abnormal; Notable for the following components:      Result Value   Ketones, POC UA small (15) (*)    Blood, UA trace-intact (*)    Leukocytes, UA Large (3+) (*)    All other components within normal limits  URINE CULTURE  POCT URINE PREGNANCY  CERVICOVAGINAL ANCILLARY ONLY    EKG   Radiology No results found.  Procedures Procedures (including critical care time)  Medications Ordered in UC Medications  cefTRIAXone (ROCEPHIN) injection 1 g (1 g Intramuscular Given 08/31/20 1153)    Initial Impression / Assessment and Plan / UC Course  I have reviewed the triage vital signs and the nursing notes.  Pertinent labs & imaging results that were available during my care of the patient were reviewed by me and considered in my medical decision making (see chart for details).     Urine as above, culture pending.  H&P concerning for PID: Given Rocephin in his office which he tolerated well.  Will start doxycycline, Flagyl.  Return precautions discussed, pt verbalized understanding and is agreeable to plan. Final Clinical Impressions(s) / UC Diagnoses    Final diagnoses:  PID (acute pelvic inflammatory disease)     Discharge Instructions     Very informed to take  both antibiotics twice daily with food.    ED Prescriptions    Medication Sig Dispense Auth. Provider   doxycycline (VIBRAMYCIN) 100 MG capsule Take 1 capsule (100 mg total) by mouth 2 (two) times daily for 14 days. 28 capsule Hall-Potvin, [place], PA-C   metroNIDAZOLE (FLAGYL) 500 MG tablet Take 1 tablet (500 mg total) by mouth 2 (two) times daily for 14 days. 28 tablet Hall-Potvin, [place], PA-C     PDMP not reviewed this encounter.   Hall-Potvin, [place], [place] 08/31/20 1224

## 2020-08-31 NOTE — ED Triage Notes (Signed)
Patient states she had a green discharge and severe abdominal pain/cramping beginning last night. Pt states her insides feel swollen and it is uncomfortable to sit. Pt is aox4 and ambulatory.

## 2020-08-31 NOTE — Discharge Instructions (Addendum)
Very informed to take both antibiotics twice daily with food.

## 2020-09-01 LAB — CERVICOVAGINAL ANCILLARY ONLY
Chlamydia: NEGATIVE
Comment: NEGATIVE
Comment: NEGATIVE
Comment: NORMAL
Neisseria Gonorrhea: NEGATIVE
Trichomonas: NEGATIVE

## 2020-09-01 LAB — URINE CULTURE

## 2020-09-11 ENCOUNTER — Ambulatory Visit: Payer: Medicaid Other | Admitting: Family

## 2020-09-11 ENCOUNTER — Encounter: Payer: Self-pay | Admitting: Family

## 2020-09-11 ENCOUNTER — Other Ambulatory Visit: Payer: Self-pay

## 2020-09-11 VITALS — BP 123/85 | HR 82 | Temp 97.3°F | Resp 16 | Ht 63.0 in | Wt 133.0 lb

## 2020-09-11 DIAGNOSIS — N730 Acute parametritis and pelvic cellulitis: Secondary | ICD-10-CM | POA: Diagnosis not present

## 2020-09-11 DIAGNOSIS — Z23 Encounter for immunization: Secondary | ICD-10-CM | POA: Diagnosis not present

## 2020-09-11 DIAGNOSIS — Z7689 Persons encountering health services in other specified circumstances: Secondary | ICD-10-CM

## 2020-09-11 NOTE — Patient Instructions (Addendum)
Return in 4 to 6 weeks or sooner if needed for annual physical examination, labs, and health maintenance. Arrive fasting meaning having had no food and/or nothing to drink for at least 8 hours prior to appointment.  Continue Doxycycline and Metronidazole for PID.   Referral to Gynecology for PID.   Flu vaccine today. Thank you for choosing Primary Care at [facility] for your medical home!    Joanne Aguilar was seen by Rema Fendt, NP today.   Joanne Aguilar's primary care provider is Dagan Heinz Jodi Geralds.   For the best care possible,  you should try to see Ricky Stabs, NP whenever you come to clinic.   We look forward to seeing you again soon!  If you have any questions about your visit today,  please call us at [phone]  Or feel free to reach your provider via MyChart.     Pelvic Inflammatory Disease  Pelvic inflammatory disease (PID) is an infection in some or all of the female reproductive organs. PID can be in the womb (uterus), ovaries, fallopian tubes, or nearby tissues that are inside the lower belly area (pelvis). PID can lead to problems if it is not treated. What are the causes?  Germs (bacteria) that are spread during sex. This is the most common cause.  Germs in the vagina that are not spread during sex.  Germs that travel up from the vagina or cervix to the reproductive organs after: ? The birth of a baby. ? A miscarriage. ? An abortion. ? Pelvic surgery. ? Insertion of an intrauterine device (IUD). ? A sexual assault. What increases the risk?  Being younger than 26 years old.  Having sex at a young age.  Having a history of STI (sexually transmitted infection) or PID.  Not using barrier birth control, such as condoms.  Having a lot of sex partners.  Having sex with someone who has symptoms of an STI.  Using a douche.  Having an IUD put in place. What are the signs or symptoms?  Pain in the belly  area.  Fever.  Chills.  Discharge from the vagina that is not normal.  Bleeding from the womb that is not normal.  Pain soon after the end of a menstrual period.  Pain when you pee (urinate).  Pain with sex.  Feeling sick to your stomach (nauseous) or throwing up (vomiting). How is this treated?  Antibiotic medicines. In very bad cases, these may be given through an IV tube.  Surgery. This is rare.  Efforts to stop the spread of the infection. Sex partners may need to be treated. It may take weeks until you feel all better. Your doctor may test you for infection again after you finish treatment. You should also be checked for HIV (human immunodeficiency virus). Follow these instructions at home:  Take over-the-counter and prescription medicines only as told by your doctor.  If you were prescribed an antibiotic medicine, take it as told by your doctor. Do not stop taking it even if you start to feel better.  Do not have sex until treatment is done or as told by your doctor.  Tell your sex partner if you have PID. Your partner may need to be treated.  Keep all follow-up visits as told by your doctor. This is important. Contact a doctor if:  You have more fluid or fluid that is not normal coming from your vagina.  Your pain does not improve.  You throw up.  You have  a fever.  You cannot take your medicines.  Your partner has an STI.  You have pain when you pee. Get help right away if:  You have more pain in the belly area.  You have chills.  You are not better in 72 hours with treatment. Summary  Pelvic inflammatory disease (PID) is caused by an infection in some or all of the female reproductive organs.  PID is a serious infection.  This infection is most often treated with antibiotics.  Do not have sex until treatment is done or as told by your doctor. This information is not intended to replace advice given to you by your health care provider. Make  sure you discuss any questions you have with your health care provider. Document Revised: 05/10/2018 Document Reviewed: 05/16/2018 Elsevier Patient Education  2020 [employer].

## 2020-09-11 NOTE — Progress Notes (Signed)
Reports vaginal discomfort x 1 wk, unsure if having discharge, currently on meds for PID   Flu vaccine today   Reports pap in 2019 or 2020, can't recall exact time frame or location

## 2020-09-11 NOTE — Progress Notes (Addendum)
Subjective:    Joanne Aguilar - 25 y.o. female MRN [id number]  Date of birth: [birth year]  HPI  Joanne Aguilar is to establish care. Patient has a PMH significant for urinary tract infection and depression.   Current issues and/or concerns: 1. PID FOLLOW-UP: 08/31/2020: Visit at [facility]. Patient presentation concerning for pelvic inflammatory disease. Rocephin given in office. Started on Doxycycline and Metronidazole.   09/11/2020: Reports vaginal areas feels swollen. Still having vaginal discharge, initially green but now white, denies odor. Reports had intercourse on 09/05/2020 and 09/10/2020, nonpainful. LMP 08/25/2020 and was painful. Reports consistently taking antibiotics and has 2 days left.    ROS per HPI   Health Maintenance:  - Flu vaccine today. Health Maintenance Due  Topic Date Due  . Hepatitis C Screening  Never done  . PAP-Cervical Cytology Screening  03/03/2020  . PAP SMEAR-Modifier  03/03/2020  . INFLUENZA VACCINE  Never done     Past Medical History: Patient Active Problem List   Diagnosis Date Noted  . UTI (urinary tract infection) 03/03/2017  . Breast pain in female 03/03/2017  . Cervical lesion 03/03/2017  . Nexplanon removal 01/12/2017  . Vaginal discharge 12/20/2016  . Depression 12/20/2016  . STD exposure 12/20/2016  . NSVD (normal spontaneous vaginal delivery) 05/09/2015      Social History   reports that she has been smoking cigarettes. She has never used smokeless tobacco. She reports previous alcohol use. She reports previous drug use. Drug: Marijuana.   Family History  family history includes Hypertension in her maternal grandmother and mother.   Medications: reviewed and updated   Objective:   Physical Exam BP 123/85 (BP Location: Right Arm, Patient Position: Sitting, Cuff Size: Normal)   Pulse 82   Temp (!) 97.3 F (36.3 C) (Temporal)   Resp 16   Ht 5\' 3"  (1.6 m)   Wt 133 lb (60.3 kg)   LMP  02/15/2020   SpO2 95%   BMI 23.56 kg/m  Physical Exam Constitutional:      Appearance: Normal appearance. She is normal weight.  HENT:     Head: Normocephalic.  Eyes:     Extraocular Movements: Extraocular movements intact.     Pupils: Pupils are equal, round, and reactive to light.  Cardiovascular:     Rate and Rhythm: Normal rate and regular rhythm.     Pulses: Normal pulses.     Heart sounds: Normal heart sounds.  Pulmonary:     Effort: Pulmonary effort is normal.     Breath sounds: Normal breath sounds.  Genitourinary:    Comments: Patient declined examination. Musculoskeletal:     Cervical back: Normal range of motion and neck supple.  Neurological:     General: No focal deficit present.     Mental Status: She is alert and oriented to person, place, and time.  Psychiatric:        Mood and Affect: Mood normal.        Behavior: Behavior normal.      Assessment & Plan:  1. Encounter to establish care: - Patient presents today to establish care.  - Return in 4 to 6 weeks or sooner if needed for annual physical examination, labs, and health maintenance. Arrive fasting meaning having had no food and/or nothing to drink for at least 8 hours prior to appointment.  2. PID (acute pelvic inflammatory disease): - Visit at [facility] on 08/31/2020. Patient presentation concerning for pelvic  inflammatory disease. Rocephin given in office. Started on Doxycycline and Metronidazole.  - Today reports vaginal areas feels swollen. Still having vaginal discharge, initially green but now white, denies odor. Reports had intercourse on 09/05/2020 and 09/10/2020, nonpainful. LMP 08/25/2020 and was painful. Reports consistently taking antibiotics and has 2 days left. - Referral to Gynecology for further evaluation and management. - Ambulatory referral to Gynecology  3. Flu vaccine need: - Flu vaccine administered during today's visit. - Flu Vaccine QUAD 6+ mos PF IM  (Fluarix Quad PF)  Ricky Stabs, NP 09/11/2020, 2:45 PM Primary Care at [facility]

## 2020-09-13 ENCOUNTER — Other Ambulatory Visit: Payer: Self-pay

## 2020-09-13 ENCOUNTER — Encounter (HOSPITAL_COMMUNITY): Payer: Self-pay

## 2020-09-13 ENCOUNTER — Emergency Department (HOSPITAL_COMMUNITY)
Admission: EM | Admit: 2020-09-13 | Discharge: 2020-09-14 | Disposition: A | Discharge status: Home / Self Care | Payer: Medicaid Other | Attending: Emergency Medicine | Admitting: Emergency Medicine

## 2020-09-13 DIAGNOSIS — N200 Calculus of kidney: Secondary | ICD-10-CM | POA: Insufficient documentation

## 2020-09-13 DIAGNOSIS — N899 Noninflammatory disorder of vagina, unspecified: Secondary | ICD-10-CM | POA: Insufficient documentation

## 2020-09-13 DIAGNOSIS — F17210 Nicotine dependence, cigarettes, uncomplicated: Secondary | ICD-10-CM | POA: Diagnosis not present

## 2020-09-13 DIAGNOSIS — N898 Other specified noninflammatory disorders of vagina: Secondary | ICD-10-CM

## 2020-09-13 DIAGNOSIS — R102 Pelvic and perineal pain: Secondary | ICD-10-CM | POA: Diagnosis present

## 2020-09-13 LAB — WET PREP, GENITAL
Clue Cells Wet Prep HPF POC: NONE SEEN
Sperm: NONE SEEN
Trich, Wet Prep: NONE SEEN
Yeast Wet Prep HPF POC: NONE SEEN

## 2020-09-13 LAB — I-STAT BETA HCG BLOOD, ED (MC, WL, AP ONLY): I-stat hCG, quantitative: <5 m[IU]/mL (ref <5)

## 2020-09-13 MED ORDER — VALACYCLOVIR HCL 1 G PO TABS: 1000.0000 mg | ORAL_TABLET | Freq: Two times a day (BID) | ORAL | 0 refills | Status: AC

## 2020-09-13 NOTE — ED Provider Notes (Signed)
Pending UA Has herpetic infection If UA neg, has meds and instructions for Herpes outbreak If UA positive, add antibiotics.  12:45 - checked with lab regarding UA specimen. They confirm they have it and it is running. To result soon.   12:50 - UA negative for infection. Can be discharged home per plan of previous treatment team.     Elpidio Anis, PA-C 09/14/20 0054    Horton, Clabe Seal, DO 09/16/20 1841

## 2020-09-13 NOTE — Discharge Instructions (Signed)
As we discussed I think you most likely have herpes, since it has been 2 to 3 days, hopefully the Valtrex will help.  I want you to follow-up with your PCP, you can also follow-up with the [facility] outpatient center if you do not have a gynecologist.  If you have any new or worsening concerning symptoms please come back to the emergency department, you will get your gonorrhea chlamydia and your herpes result in the next 2 days, as we discussed the herpes result might not be 100% accurate since there were no lesions that were open.  Please use the attached instructions.  Please wear protection and refrain from sex until your lesions have resolved.  Please speak to your pharmacist about any new medications prescribed today in regards to side effects or interactions.  Contact a health care provider if: Your symptoms are not improving with medicine. Your symptoms return, or you have new symptoms. You have a fever. You have abdominal pain. You have redness, swelling, or pain in your eye. You notice new sores on other parts of your body. You are a woman and you experience bleeding between menstrual periods. You have had herpes and you become pregnant or plan to become pregnant.

## 2020-09-13 NOTE — ED Provider Notes (Signed)
[facility] Note   CSN: [id number] Arrival date & time: 09/13/20  2059     History Chief Complaint  Patient presents with  . Vaginal Pain    Joanne Aguilar is a 25 y.o. female with pertinent past medical history of UTI, cervical lesion that presents the emergency department today for vaginal discomfort and vaginal lesions.  Patient states that she had intercourse on New Year's Day, 8 days ago and started having vaginal discomfort, 2-3 days ago she started having vaginal lesions.  Patient states that they are on the outside of her right outer labia/thigh.  Patient states that they are very painful and stinging when they are touched.  Also admits to some dysuria.  Denies any abnormal vaginal discharge or vaginal bleeding.  Patient states that she was not using protection.  States that she knows the person that she had intercourse with, does not think that they had any STDs.  Patient states that she is currently being treated for pelvic inflammatory disease, states that she took her last antibiotics today.  Per chart review patient was seen in urgent care on December 27, given Rocephin and started doxycycline and Flagyl.  Patient states that she was diagnosed with pelvic inflammatory disease, however her GC came back negative.  Patient denies any hematuria or urinary frequency.  Does admit to some vaginal itching.  Denies any other complaints at this time.  No abdominal pain nausea, vomiting, fevers, chills, back pain.  HPI     Past Medical History:  Diagnosis Date  . Anemia   . Kidney stones     Patient Active Problem List   Diagnosis Date Noted  . UTI (urinary tract infection) 03/03/2017  . Breast pain in female 03/03/2017  . Cervical lesion 03/03/2017  . Nexplanon removal 01/12/2017  . Vaginal discharge 12/20/2016  . Depression 12/20/2016  . STD exposure 12/20/2016  . NSVD (normal spontaneous vaginal delivery) 05/09/2015    Past  Surgical History:  Procedure Laterality Date  . TOOTH EXTRACTION       OB History    Gravida  2   Para  1   Term  1   Preterm      AB      Living  1     SAB      IAB      Ectopic      Multiple  0   Live Births  1           Family History  Problem Relation Age of Onset  . Hypertension Mother   . Hypertension Maternal Grandmother     Social History   Tobacco Use  . Smoking status: Current Some Day Smoker    Types: Cigarettes  . Smokeless tobacco: Never Used  . Tobacco comment: 1 cigarettes  Vaping Use  . Vaping Use: Never used  Substance Use Topics  . Alcohol use: Not Currently    Comment: socially  . Drug use: Not Currently    Types: Marijuana    Comment: None since before pregnancy    Home Medications Prior to Admission medications   Medication Sig Start Date End Date Taking? Authorizing Provider  valACYclovir (VALTREX) 1000 MG tablet Take 1 tablet (1,000 mg total) by mouth 2 (two) times daily for 10 days. 09/13/20 09/23/20 Yes Steffani Dionisio, PA-C  doxycycline (VIBRAMYCIN) 100 MG capsule Take 1 capsule (100 mg total) by mouth 2 (two) times daily for 14 days. 08/31/20 09/14/20  Hall-Potvin, [place], PA-C  metroNIDAZOLE (FLAGYL) 500 MG tablet Take 1 tablet (500 mg total) by mouth 2 (two) times daily for 14 days. 08/31/20 09/14/20  Hall-Potvin, [place], PA-C  ondansetron (ZOFRAN ODT) 4 MG disintegrating tablet Take 1 tablet (4 mg total) by mouth every 8 (eight) hours as needed for nausea or vomiting. Patient not taking: Reported on 09/11/2020 04/22/20   Sharyon Cable, CNM  Prenatal Vit-Fe Fumarate-FA (PRENATAL MULTIVITAMIN) TABS tablet Take 1 tablet by mouth daily at 12 noon. Patient not taking: Reported on 09/11/2020    [provider]    Allergies    Patient has no known allergies.  Review of Systems   Review of Systems  Constitutional: Negative for diaphoresis, fatigue and fever.  Eyes: Negative for visual disturbance.  Respiratory:  Negative for shortness of breath.   Cardiovascular: Negative for chest pain.  Gastrointestinal: Negative for nausea and vomiting.  Genitourinary: Positive for dysuria, genital sores and vaginal pain. Negative for decreased urine volume, enuresis, flank pain, frequency, menstrual problem, pelvic pain and urgency.  Musculoskeletal: Negative for back pain and myalgias.  Skin: Negative for color change, pallor, rash and wound.  Neurological: Negative for syncope, weakness, light-headedness, numbness and headaches.  Psychiatric/Behavioral: Negative for behavioral problems and confusion.    Physical Exam Updated Vital Signs BP (!) 137/97 (BP Location: Left Arm)   Pulse 72   Temp 98.7 F (37.1 C) (Oral)   Resp 16   LMP 02/15/2020   SpO2 100%   Physical Exam Constitutional:      General: She is not in acute distress.    Appearance: Normal appearance. She is not ill-appearing, toxic-appearing or diaphoretic.  HENT:     Head: Normocephalic.  Cardiovascular:     Rate and Rhythm: Normal rate and regular rhythm.     Pulses: Normal pulses.  Pulmonary:     Effort: Pulmonary effort is normal.     Breath sounds: Normal breath sounds.  Abdominal:     General: Abdomen is flat. Bowel sounds are normal. There is no distension.     Palpations: Abdomen is soft.     Tenderness: There is no abdominal tenderness. There is no right CVA tenderness, left CVA tenderness or guarding.  Genitourinary:      Comments: Patient with outer labia with 3 small lesions, very painful to touch, or not open.  Do appear vesicular.  No other lesions noted elsewhere.  Vaginal exam without any lesions, no discharge.  Cervix closed, nonfriable, no discharge or bleeding.  Patient tolerated pelvic exam well.  Bimanual without any cervical motion tenderness or adnexal tenderness. Musculoskeletal:        General: Normal range of motion.  Skin:    General: Skin is warm and dry.     Capillary Refill: Capillary refill takes  less than 2 seconds.  Neurological:     General: No focal deficit present.     Mental Status: She is alert and oriented to person, place, and time.  Psychiatric:        Mood and Affect: Mood normal.        Behavior: Behavior normal.        Thought Content: Thought content normal.     ED Results / Procedures / Treatments   Labs (all labs ordered are listed, but only abnormal results are displayed) Labs Reviewed  HSV CULTURE AND TYPING  WET PREP, GENITAL  URINALYSIS, ROUTINE W REFLEX MICROSCOPIC  I-STAT BETA HCG BLOOD, ED ([facility], [facility], [facility] ONLY)  GC/CHLAMYDIA PROBE AMP ([place])  NOT AT [facility]    EKG None  Radiology No results found.  Procedures Procedures (including critical care time)  Medications Ordered in ED Medications - No data to display  ED Course  I have reviewed the triage vital signs and the nursing notes.  Pertinent labs & imaging results that were available during my care of the patient were reviewed by me and considered in my medical decision making (see chart for details).    MDM Rules/Calculators/A&P                         Derinda Bartus is a 25 y.o. female with pertinent past medical history of UTI, cervical lesion that presents the emergency department today for ongoing vaginal discomfort and vaginal lesions.  Patient with vaginal exam concerning for herpes simplex, did discuss this with patient.  We will treat for this since this time since patient's had symptoms for 3 days.  Patient with normal vital signs, no signs of PID.  Work-up today with wet prep without any yeast or clue cells.  Urinalysis pending.  Pt care was handed off to S. Upstill PA-C at handoff.  Complete history and physical and current plan have been communicated.  Please refer to their note for the remainder of ED care and ultimate disposition.  Urinalysis pending.  Will treat for herpes simplex and have patient follow-up with GYN/PCP. Final Clinical Impression(s) / ED Diagnoses Final  diagnoses:  Vaginal lesion    Rx / DC Orders ED Discharge Orders         Ordered    valACYclovir (VALTREX) 1000 MG tablet  2 times daily        09/13/20 2333           Farrel Gordon, PA-C 09/13/20 2354    Horton, Clabe Seal, DO 09/14/20 0013

## 2020-09-13 NOTE — ED Triage Notes (Signed)
Pt sts having sex on new years day and has ongoing vaginal discomfort and new lesions that appeared approximately 3 days later.

## 2020-09-14 LAB — URINALYSIS, ROUTINE W REFLEX MICROSCOPIC
Bacteria, UA: NONE SEEN
Bilirubin Urine: NEGATIVE
Glucose, UA: NEGATIVE mg/dL
Hgb urine dipstick: NEGATIVE
Ketones, ur: 20 mg/dL — AB
Nitrite: NEGATIVE
Protein, ur: 30 mg/dL — AB
Specific Gravity, Urine: 1.026 (ref 1.005–1.030)
pH: 5 (ref 5.0–8.0)

## 2020-09-14 LAB — GC/CHLAMYDIA PROBE AMP (~~LOC~~) NOT AT ARMC
Chlamydia: NEGATIVE
Comment: NEGATIVE
Comment: NORMAL
Neisseria Gonorrhea: NEGATIVE

## 2020-09-18 ENCOUNTER — Encounter: Payer: Self-pay | Admitting: Family

## 2020-10-26 ENCOUNTER — Encounter: Payer: Medicaid Other | Admitting: Family

## 2020-10-26 NOTE — Progress Notes (Signed)
Patient did not show for appointment.   

## 2020-11-11 ENCOUNTER — Ambulatory Visit: Payer: Medicaid Other | Admitting: Obstetrics and Gynecology

## 2020-11-11 ENCOUNTER — Encounter: Payer: Self-pay | Admitting: Obstetrics and Gynecology

## 2020-11-12 NOTE — Progress Notes (Signed)
Patient did not keep her GYN appointment for 11/11/2020.  Cornelia Copa MD Attending Center for [employer] [facility])

## 2021-02-05 ENCOUNTER — Other Ambulatory Visit: Payer: Self-pay

## 2021-02-05 ENCOUNTER — Ambulatory Visit
Admission: EM | Admit: 2021-02-05 | Discharge: 2021-02-05 | Disposition: A | Discharge status: Home / Self Care | Payer: Medicaid Other | Attending: Student | Admitting: Student

## 2021-02-05 DIAGNOSIS — B9689 Other specified bacterial agents as the cause of diseases classified elsewhere: Secondary | ICD-10-CM | POA: Insufficient documentation

## 2021-02-05 DIAGNOSIS — Z113 Encounter for screening for infections with a predominantly sexual mode of transmission: Secondary | ICD-10-CM | POA: Insufficient documentation

## 2021-02-05 DIAGNOSIS — N760 Acute vaginitis: Secondary | ICD-10-CM | POA: Diagnosis present

## 2021-02-05 LAB — POCT URINALYSIS DIP (MANUAL ENTRY)
Bilirubin, UA: NEGATIVE
Glucose, UA: NEGATIVE mg/dL
Ketones, POC UA: NEGATIVE mg/dL
Nitrite, UA: NEGATIVE
Protein Ur, POC: NEGATIVE mg/dL
Spec Grav, UA: 1.015 (ref 1.010–1.025)
Urobilinogen, UA: 0.2 E.U./dL
pH, UA: 5.5 (ref 5.0–8.0)

## 2021-02-05 MED ORDER — METRONIDAZOLE 500 MG PO TABS: 500.0000 mg | ORAL_TABLET | Freq: Two times a day (BID) | ORAL | 0 refills | Status: DC

## 2021-02-05 NOTE — Discharge Instructions (Addendum)
-  Flagyl twice daily for 7 days.  This is the treatment for BV.  Make sure to avoid alcohol taking this medication as this increase your chances of nausea and vomiting. -We are testing for gonorrhea, chlamydia, trichomonas, BV, yeast, HIV, syphilis.  We will call you if anything else is positive and can send additional treatment at that time. -Your urine looks normal today, but I am sending this to the lab to grow the bacteria.  We can send additional treatment if this comes back as positive for a UTI. -Seek additional treatment if you develop new symptoms like worsening abdominal pain, back pain, new fever/chills, worsening of the vaginal symptoms despite treatment etc.

## 2021-02-05 NOTE — ED Triage Notes (Signed)
Pt presents today with approx greater than 1 week h/o urinary frequency, urinary urgency and vaginal irritation and onset this morning of right sided abdominal pain. Pt reports that sxs started a few days after she engaged in sexual intercourse. Denies discharge.. Confirms intermittent odor. No n/v/d. Denies vaginal itching. Has taken one dose of AZO earlier today without relief.

## 2021-02-05 NOTE — ED Provider Notes (Signed)
[name]    CSN: [id number] Arrival date & time: 02/05/21  1627      History   Chief Complaint Chief Complaint  Patient presents with  . Urinary Frequency  . Urinary Urgency    HPI Joanne Aguilar is a 25 y.o. female presenting with urinary frequency, urgency and vaginal irritation x1 week.  Medical history UTI, STD exposure.  Self endorses history of BV.  Notes about 1 week of urinary frequency, urgency, vaginal irritation.  States that the vaginal irritation is the last symptom to develop, this started 2 to 3 days ago.  Intermittent crampy lower right-sided abdominal pain.  Endorses vaginal odor but denies discharge.  Tried Azo without relief.  States she does have 1 female partner, denies new partners. Denies fevers/chills, back pain. States she could not be pregnant.  HPI  Past Medical History:  Diagnosis Date  . Anemia   . Kidney stones     Patient Active Problem List   Diagnosis Date Noted  . UTI (urinary tract infection) 03/03/2017  . Breast pain in female 03/03/2017  . Cervical lesion 03/03/2017  . Nexplanon removal 01/12/2017  . Vaginal discharge 12/20/2016  . Depression 12/20/2016  . STD exposure 12/20/2016  . NSVD (normal spontaneous vaginal delivery) 05/09/2015    Past Surgical History:  Procedure Laterality Date  . TOOTH EXTRACTION      OB History    Gravida  2   Para  1   Term  1   Preterm      AB      Living  1     SAB      IAB      Ectopic      Multiple  0   Live Births  1            Home Medications    Prior to Admission medications   Medication Sig Start Date End Date Taking? Authorizing Provider  metroNIDAZOLE (FLAGYL) 500 MG tablet Take 1 tablet (500 mg total) by mouth 2 (two) times daily. 02/05/21  Yes Rhys Martini, PA-C    Family History Family History  Problem Relation Age of Onset  . Hypertension Mother   . Hypertension Maternal Grandmother     Social History Social History   Tobacco Use   . Smoking status: Current Some Day Smoker    Types: Cigarettes  . Smokeless tobacco: Never Used  . Tobacco comment: 1 cigarettes  Vaping Use  . Vaping Use: Never used  Substance Use Topics  . Alcohol use: Not Currently    Comment: socially  . Drug use: Not Currently    Types: Marijuana    Comment: None since before pregnancy     Allergies   Patient has no known allergies.   Review of Systems Review of Systems  Constitutional: Negative for appetite change, chills, diaphoresis and fever.  Respiratory: Negative for shortness of breath.   Cardiovascular: Negative for chest pain.  Gastrointestinal: Positive for abdominal pain. Negative for blood in stool, constipation, diarrhea, nausea and vomiting.  Genitourinary: Positive for frequency and urgency. Negative for decreased urine volume, difficulty urinating, dysuria, flank pain, genital sores, hematuria, vaginal bleeding, vaginal discharge and vaginal pain.  Musculoskeletal: Negative for back pain.  Neurological: Negative for dizziness, weakness and light-headedness.  All other systems reviewed and are negative.    Physical Exam Triage Vital Signs ED Triage Vitals  Enc Vitals Group     BP 02/05/21 1754 123/84     Pulse  Rate 02/05/21 1754 76     Resp 02/05/21 1754 18     Temp 02/05/21 1754 97.9 F (36.6 C)     Temp Source 02/05/21 1754 Oral     SpO2 02/05/21 1754 98 %     Weight --      Height --      Head Circumference --      Peak Flow --      Pain Score 02/05/21 1800 3     Pain Loc --      Pain Edu? --      Excl. in GC? --    No data found.  Updated Vital Signs BP 123/84 (BP Location: Left Arm)   Pulse 76   Temp 97.9 F (36.6 C) (Oral)   Resp 18   LMP 01/27/2020   SpO2 98%   Visual Acuity Right Eye Distance:   Left Eye Distance:   Bilateral Distance:    Right Eye Near:   Left Eye Near:    Bilateral Near:     Physical Exam Vitals reviewed.  Constitutional:      General: She is not in acute  distress.    Appearance: Normal appearance. She is not ill-appearing.  HENT:     Head: Normocephalic and atraumatic.     Mouth/Throat:     Mouth: Mucous membranes are moist.     Comments: Moist mucous membranes Eyes:     Extraocular Movements: Extraocular movements intact.     Pupils: Pupils are equal, round, and reactive to light.  Cardiovascular:     Rate and Rhythm: Normal rate and regular rhythm.     Heart sounds: Normal heart sounds.  Pulmonary:     Effort: Pulmonary effort is normal.     Breath sounds: Normal breath sounds. No wheezing, rhonchi or rales.  Abdominal:     General: Bowel sounds are normal. There is no distension.     Palpations: Abdomen is soft. There is no mass.     Tenderness: There is no abdominal tenderness. There is no right CVA tenderness, left CVA tenderness, guarding or rebound.  Genitourinary:    Comments: deferred Skin:    General: Skin is warm.     Capillary Refill: Capillary refill takes less than 2 seconds.     Comments: Good skin turgor  Neurological:     General: No focal deficit present.     Mental Status: She is alert and oriented to person, place, and time.  Psychiatric:        Mood and Affect: Mood normal.        Behavior: Behavior normal.        Thought Content: Thought content normal.        Judgment: Judgment normal.      UC Treatments / Results  Labs (all labs ordered are listed, but only abnormal results are displayed) Labs Reviewed  POCT URINALYSIS DIP (MANUAL ENTRY) - Abnormal; Notable for the following components:      Result Value   Blood, UA trace-intact (*)    Leukocytes, UA Moderate (2+) (*)    All other components within normal limits  URINE CULTURE  RPR  HIV ANTIBODY (ROUTINE TESTING W REFLEX)  CERVICOVAGINAL ANCILLARY ONLY    EKG   Radiology No results found.  Procedures Procedures (including critical care time)  Medications Ordered in UC Medications - No data to display  Initial Impression /  Assessment and Plan / UC Course  I have reviewed the triage vital signs  and the nursing notes.  Pertinent labs & imaging results that were available during my care of the patient were reviewed by me and considered in my medical decision making (see chart for details).     This patient is a 25 year old female presenting with suspected BV.  Afebrile, nontachycardic, no abdominal pain or CVAT on exam.  UA today with trace blood, moderate leuk.  Culture sent.  Patient states she cannot be pregnant.  Will defer treating for UTI pending culture as I suspect symptoms are related to BV. Self swab sent for BV, yeast, gonorrhea, chlamydia, trichomonas.  Plan to treat for suspected BV with Flagyl as below.  ED return precautions discussed.  Final Clinical Impressions(s) / UC Diagnoses   Final diagnoses:  Bacterial vaginosis  Routine screening for STI (sexually transmitted infection)     Discharge Instructions     -Flagyl twice daily for 7 days.  This is the treatment for BV.  Make sure to avoid alcohol taking this medication as this increase your chances of nausea and vomiting. -We are testing for gonorrhea, chlamydia, trichomonas, BV, yeast, HIV, syphilis.  We will call you if anything else is positive and can send additional treatment at that time. -Your urine looks normal today, but I am sending this to the lab to grow the bacteria.  We can send additional treatment if this comes back as positive for a UTI. -Seek additional treatment if you develop new symptoms like worsening abdominal pain, back pain, new fever/chills, worsening of the vaginal symptoms despite treatment etc.    ED Prescriptions    Medication Sig Dispense Auth. Provider   metroNIDAZOLE (FLAGYL) 500 MG tablet Take 1 tablet (500 mg total) by mouth 2 (two) times daily. 14 tablet Rhys Martini, PA-C     PDMP not reviewed this encounter.   Rhys Martini, PA-C 02/05/21 [phone]

## 2021-02-06 LAB — RPR: RPR Ser Ql: NONREACTIVE

## 2021-02-06 LAB — HIV ANTIBODY (ROUTINE TESTING W REFLEX): HIV Screen 4th Generation wRfx: NONREACTIVE

## 2021-02-08 LAB — CERVICOVAGINAL ANCILLARY ONLY
Bacterial Vaginitis (gardnerella): POSITIVE — AB
Candida Glabrata: NEGATIVE
Candida Vaginitis: NEGATIVE
Chlamydia: NEGATIVE
Comment: NEGATIVE
Comment: NEGATIVE
Comment: NEGATIVE
Comment: NEGATIVE
Comment: NEGATIVE
Comment: NORMAL
Neisseria Gonorrhea: NEGATIVE
Trichomonas: NEGATIVE

## 2021-02-08 LAB — URINE CULTURE

## 2021-03-09 ENCOUNTER — Telehealth (HOSPITAL_BASED_OUTPATIENT_CLINIC_OR_DEPARTMENT_OTHER): Payer: Self-pay

## 2021-03-09 NOTE — Telephone Encounter (Signed)
called Pt to make her aware of our age limits

## 2021-03-31 ENCOUNTER — Ambulatory Visit (HOSPITAL_BASED_OUTPATIENT_CLINIC_OR_DEPARTMENT_OTHER): Payer: Medicaid Other | Admitting: Family Medicine

## 2021-04-01 ENCOUNTER — Encounter (HOSPITAL_BASED_OUTPATIENT_CLINIC_OR_DEPARTMENT_OTHER): Payer: Self-pay

## 2021-04-01 ENCOUNTER — Ambulatory Visit (HOSPITAL_BASED_OUTPATIENT_CLINIC_OR_DEPARTMENT_OTHER): Payer: Medicaid Other | Admitting: Family Medicine

## 2021-05-21 ENCOUNTER — Ambulatory Visit: Payer: Self-pay | Admitting: Pediatrics

## 2021-06-02 ENCOUNTER — Ambulatory Visit: Payer: Self-pay | Admitting: Pediatrics

## 2021-06-23 ENCOUNTER — Encounter (HOSPITAL_COMMUNITY): Payer: Self-pay | Admitting: Emergency Medicine

## 2021-06-23 ENCOUNTER — Other Ambulatory Visit: Payer: Self-pay

## 2021-06-23 ENCOUNTER — Ambulatory Visit (HOSPITAL_COMMUNITY)
Admission: EM | Admit: 2021-06-23 | Discharge: 2021-06-23 | Disposition: A | Discharge status: Home / Self Care | Payer: Medicaid Other | Attending: Family Medicine | Admitting: Family Medicine

## 2021-06-23 DIAGNOSIS — N3090 Cystitis, unspecified without hematuria: Secondary | ICD-10-CM

## 2021-06-23 LAB — POCT URINALYSIS DIPSTICK, ED / UC
Bilirubin Urine: NEGATIVE
Glucose, UA: NEGATIVE mg/dL
Ketones, ur: 15 mg/dL — AB
Nitrite: POSITIVE — AB
Protein, ur: 30 mg/dL — AB
Specific Gravity, Urine: 1.015 (ref 1.005–1.030)
Urobilinogen, UA: 1 mg/dL (ref 0.0–1.0)
pH: 6 (ref 5.0–8.0)

## 2021-06-23 LAB — POC URINE PREG, ED: Preg Test, Ur: NEGATIVE

## 2021-06-23 MED ORDER — CEPHALEXIN 500 MG PO CAPS: 500.0000 mg | ORAL_CAPSULE | Freq: Two times a day (BID) | ORAL | 0 refills | Status: DC

## 2021-06-23 NOTE — ED Triage Notes (Signed)
Right flank pain for 3 days.  Requesting sti evaluation.  Burning with urination, denies vaginal discharge

## 2021-06-24 LAB — CERVICOVAGINAL ANCILLARY ONLY
Bacterial Vaginitis (gardnerella): POSITIVE — AB
Candida Glabrata: NEGATIVE
Candida Vaginitis: NEGATIVE
Chlamydia: NEGATIVE
Comment: NEGATIVE
Comment: NEGATIVE
Comment: NEGATIVE
Comment: NEGATIVE
Comment: NEGATIVE
Comment: NORMAL
Neisseria Gonorrhea: NEGATIVE
Trichomonas: NEGATIVE

## 2021-06-26 LAB — URINE CULTURE: Culture: >=100000 — AB

## 2021-06-26 NOTE — ED Provider Notes (Signed)
  [facility]   [id number] 06/23/21 Arrival Time: 1625  ASSESSMENT & PLAN:  1. Cystitis    Vaginal cytology pending. Urine culture pending. Without s/s of PID.  Labs Reviewed  POCT URINALYSIS DIPSTICK, ED / UC - Abnormal; Notable for the following components:   Ketones, ur 15 (*)    Hgb urine dipstick SMALL (*)    Protein, ur 30 (*)    Nitrite POSITIVE (*)    Leukocytes,Ua LARGE (*)    All other components within normal limits  POC URINE PREG, ED   UPT negative.  Begin: Meds ordered this encounter  Medications   cephALEXin (KEFLEX) 500 MG capsule    Sig: Take 1 capsule (500 mg total) by mouth 2 (two) times daily.    Dispense:  10 capsule    Refill:  0    Will notify of any positive results. Instructed to refrain from sexual activity for at least seven days.  Reviewed expectations re: course of current medical issues. Questions answered. Outlined signs and symptoms indicating need for more acute intervention. Patient verbalized understanding. After Visit Summary given.   SUBJECTIVE:  Joanne Aguilar is a 25 y.o. female who presents with complaint of R flank discomfort; x 3 days. Denies vaginal discharge. Onset gradual. No specific aggravating or alleviating factors reported. Denies: gross hematuria. Afebrile. No abdominal or pelvic pain. Normal PO intake wihout n/v. No genital rashes or lesions. Reports that she is sexually active  Patient's last menstrual period was 06/12/2021.   OBJECTIVE:  Vitals:   06/23/21 1720  BP: 115/81  Pulse: 82  Resp: 18  Temp: 98.6 F (37 C)  TempSrc: Oral  SpO2: 100%     General appearance: alert, cooperative, appears stated age and no distress Lungs: unlabored respirations; speaks full sentences without difficulty Back: no CVA tenderness; FROM at waist Abdomen: soft, non-tender GU: deferred Skin: warm and dry Psychological: alert and cooperative; normal mood and affect.    No Known Allergies  Past  Medical History:  Diagnosis Date   Anemia    Kidney stones    Family History  Problem Relation Age of Onset   Hypertension Mother    Hypertension Maternal Grandmother    Social History   Socioeconomic History   Marital status: Single    Spouse name: Not on file   Number of children: Not on file   Years of education: Not on file   Highest education level: Not on file  Occupational History   Not on file  Tobacco Use   Smoking status: Some Days    Types: Cigarettes   Smokeless tobacco: Never   Tobacco comments:    1 cigarettes  Vaping Use   Vaping Use: Never used  Substance and Sexual Activity   Alcohol use: Not Currently    Comment: socially   Drug use: Not Currently    Types: Marijuana    Comment: None since before pregnancy   Sexual activity: Yes    Birth control/protection: None  Other Topics Concern   Not on file  Social History Narrative   Not on file   Social Determinants of Health   Financial Resource Strain: Not on file  Food Insecurity: Not on file  Transportation Needs: Not on file  Physical Activity: Not on file  Stress: Not on file  Social Connections: Not on file  Intimate Partner Violence: Not on file           Cash, MD 06/26/21 [phone]

## 2021-06-30 ENCOUNTER — Telehealth: Payer: Self-pay

## 2021-06-30 MED ORDER — METRONIDAZOLE 500 MG PO TABS: 500.0000 mg | ORAL_TABLET | Freq: Two times a day (BID) | ORAL | 0 refills | Status: DC

## 2021-09-13 ENCOUNTER — Ambulatory Visit: Payer: Self-pay | Admitting: Pediatrics

## 2021-11-30 ENCOUNTER — Encounter: Payer: Medicaid Other | Admitting: Obstetrics

## 2021-12-21 ENCOUNTER — Ambulatory Visit
Admission: EM | Admit: 2021-12-21 | Discharge: 2021-12-21 | Disposition: A | Discharge status: Home / Self Care | Payer: Medicaid Other | Attending: Internal Medicine | Admitting: Internal Medicine

## 2021-12-21 ENCOUNTER — Other Ambulatory Visit: Payer: Self-pay

## 2021-12-21 ENCOUNTER — Encounter: Payer: Self-pay | Admitting: Emergency Medicine

## 2021-12-21 DIAGNOSIS — R300 Dysuria: Secondary | ICD-10-CM | POA: Diagnosis present

## 2021-12-21 DIAGNOSIS — Z113 Encounter for screening for infections with a predominantly sexual mode of transmission: Secondary | ICD-10-CM | POA: Diagnosis present

## 2021-12-21 DIAGNOSIS — N939 Abnormal uterine and vaginal bleeding, unspecified: Secondary | ICD-10-CM

## 2021-12-21 DIAGNOSIS — N3001 Acute cystitis with hematuria: Secondary | ICD-10-CM | POA: Diagnosis present

## 2021-12-21 LAB — POCT URINALYSIS DIP (MANUAL ENTRY)
Glucose, UA: NEGATIVE mg/dL
Nitrite, UA: NEGATIVE
Protein Ur, POC: 100 mg/dL — AB
Spec Grav, UA: >=1.03 — AB (ref 1.010–1.025)
Urobilinogen, UA: 0.2 E.U./dL
pH, UA: 6.5 (ref 5.0–8.0)

## 2021-12-21 LAB — POCT URINE PREGNANCY: Preg Test, Ur: NEGATIVE

## 2021-12-21 MED ORDER — CEPHALEXIN 500 MG PO CAPS: 500.0000 mg | ORAL_CAPSULE | Freq: Four times a day (QID) | ORAL | 0 refills | Status: AC

## 2021-12-21 NOTE — ED Triage Notes (Signed)
Pt here for vaginal bleeding x 2 weeks that is spotting; pt also requests STD check ?

## 2021-12-21 NOTE — ED Provider Notes (Signed)
?[name] ? ? ? ?CSN: [id number] ?Arrival date & time: 12/21/21  0820 ? ? ?  ? ?History   ?Chief Complaint ?Chief Complaint  ?Patient presents with  ? Vaginal Bleeding  ? ? ?HPI ?Joanne Aguilar is a 26 y.o. female.  ? ?Patient presents with vaginal bleeding that has been present for approximately 2 weeks.  Patient reports that it started with vaginal spotting and bleeding has increased slightly since the start.  Patient reports that she does not have to change her menstrual pad or tampon very often.  She states that the bleeding has increased over the past few days and she is attributing this to her starting her normal menstrual cycle at this time.  Patient does report associated urinary burning but denies urinary frequency, vaginal discharge, hematuria, abdominal pain, fever, back pain.  Denies any known exposure to STD has had unprotected sexual intercourse recently.  Patient reports that this has occurred before and she had bacterial vaginosis that was the cause. ? ? ?Vaginal Bleeding ? ?Past Medical History:  ?Diagnosis Date  ? Anemia   ? Kidney stones   ? ? ?Patient Active Problem List  ? Diagnosis Date Noted  ? UTI (urinary tract infection) 03/03/2017  ? Breast pain in female 03/03/2017  ? Cervical lesion 03/03/2017  ? Nexplanon removal 01/12/2017  ? Vaginal discharge 12/20/2016  ? Depression 12/20/2016  ? STD exposure 12/20/2016  ? NSVD (normal spontaneous vaginal delivery) 05/09/2015  ? ? ?Past Surgical History:  ?Procedure Laterality Date  ? TOOTH EXTRACTION    ? ? ?OB History   ? ? Gravida  ?2  ? Para  ?1  ? Term  ?1  ? Preterm  ?   ? AB  ?   ? Living  ?1  ?  ? ? SAB  ?   ? IAB  ?   ? Ectopic  ?   ? Multiple  ?0  ? Live Births  ?1  ?   ?  ?  ? ? ? ?Home Medications   ? ?Prior to Admission medications   ?Medication Sig Start Date End Date Taking? Authorizing Provider  ?cephALEXin (KEFLEX) 500 MG capsule Take 1 capsule (500 mg total) by mouth 4 (four) times daily. 12/21/21  Yes Gustavus Bryant,  FNP  ? ? ?Family History ?Family History  ?Problem Relation Age of Onset  ? Hypertension Mother   ? Hypertension Maternal Grandmother   ? ? ?Social History ?Social History  ? ?Tobacco Use  ? Smoking status: Some Days  ?  Types: Cigarettes  ? Smokeless tobacco: Never  ? Tobacco comments:  ?  1 cigarettes  ?Vaping Use  ? Vaping Use: Never used  ?Substance Use Topics  ? Alcohol use: Not Currently  ?  Comment: socially  ? Drug use: Not Currently  ?  Types: Marijuana  ?  Comment: None since before pregnancy  ? ? ? ?Allergies   ?Patient has no known allergies. ? ? ?Review of Systems ?Review of Systems ?Per HPI ? ?Physical Exam ?Triage Vital Signs ?ED Triage Vitals  ?Enc Vitals Group  ?   BP 12/21/21 0846 (!) 148/87  ?   Pulse Rate 12/21/21 0846 82  ?   Resp 12/21/21 0846 18  ?   Temp 12/21/21 0846 98.4 ?F (36.9 ?C)  ?   Temp Source 12/21/21 0846 Oral  ?   SpO2 12/21/21 0846 97 %  ?   Weight --   ?   Height --   ?  Head Circumference --   ?   Peak Flow --   ?   Pain Score 12/21/21 0847 2  ?   Pain Loc --   ?   Pain Edu? --   ?   Excl. in GC? --   ? ?No data found. ? ?Updated Vital Signs ?BP (!) 148/87 (BP Location: Left Arm)   Pulse 82   Temp 98.4 ?F (36.9 ?C) (Oral)   Resp 18   SpO2 97%  ? ?Visual Acuity ?Right Eye Distance:   ?Left Eye Distance:   ?Bilateral Distance:   ? ?Right Eye Near:   ?Left Eye Near:    ?Bilateral Near:    ? ?Physical Exam ?Constitutional:   ?   General: She is not in acute distress. ?   Appearance: Normal appearance. She is not toxic-appearing or diaphoretic.  ?HENT:  ?   Head: Normocephalic and atraumatic.  ?Eyes:  ?   Extraocular Movements: Extraocular movements intact.  ?   Conjunctiva/sclera: Conjunctivae normal.  ?Cardiovascular:  ?   Rate and Rhythm: Normal rate and regular rhythm.  ?   Pulses: Normal pulses.  ?   Heart sounds: Normal heart sounds.  ?Pulmonary:  ?   Effort: Pulmonary effort is normal. No respiratory distress.  ?   Breath sounds: Normal breath sounds.  ?Abdominal:  ?    General: Bowel sounds are normal. There is no distension.  ?   Palpations: Abdomen is soft.  ?   Tenderness: There is no abdominal tenderness.  ?Genitourinary: ?   Comments: Deferred with shared decision making.  Self swab performed. ?Neurological:  ?   General: No focal deficit present.  ?   Mental Status: She is alert and oriented to person, place, and time. Mental status is at baseline.  ?Psychiatric:     ?   Mood and Affect: Mood normal.     ?   Behavior: Behavior normal.     ?   Thought Content: Thought content normal.     ?   Judgment: Judgment normal.  ? ? ? ?UC Treatments / Results  ?Labs ?(all labs ordered are listed, but only abnormal results are displayed) ?Labs Reviewed  ?POCT URINALYSIS DIP (MANUAL ENTRY) - Abnormal; Notable for the following components:  ?    Result Value  ? Color, UA other (*)   ? Clarity, UA cloudy (*)   ? Bilirubin, UA small (*)   ? Ketones, POC UA small (15) (*)   ? Spec Grav, UA >=1.030 (*)   ? Blood, UA large (*)   ? Protein Ur, POC =100 (*)   ? Leukocytes, UA Trace (*)   ? All other components within normal limits  ?URINE CULTURE  ?POCT URINE PREGNANCY  ?CERVICOVAGINAL ANCILLARY ONLY  ? ? ?EKG ? ? ?Radiology ?No results found. ? ?Procedures ?Procedures (including critical care time) ? ?Medications Ordered in UC ?Medications - No data to display ? ?Initial Impression / Assessment and Plan / UC Course  ?I have reviewed the triage vital signs and the nursing notes. ? ?Pertinent labs & imaging results that were available during my care of the patient were reviewed by me and considered in my medical decision making (see chart for details). ? ?  ? ?Urine pregnancy was negative.  Urinalysis indicating possible urinary tract infection as cause of patient's symptoms.  Trace leukocytes but other abnormalities in urine indicate possible urinary tract infection with spillage of other contents.  Will treat with cephalexin as patient is  currently breast-feeding.  Urine culture is pending.   Cervicovaginal swab pending to rule out other etiologies as well.  Patient advised to follow-up with contact information for gynecology for further evaluation and management as well.  Do not think that any blood work is necessary at this time given small amounts of bleeding.  Discussed strict return and ER precautions.  Patient verbalized understanding and was agreeable with plan. ?Final Clinical Impressions(s) / UC Diagnoses  ? ?Final diagnoses:  ?Acute cystitis with hematuria  ?Abnormal vaginal bleeding  ?Screening examination for venereal disease  ?Dysuria  ? ? ? ?Discharge Instructions   ? ?  ?It appears that you may have a urinary tract infection causing your symptoms so you are being treated with an antibiotic.  Urine culture and vaginal swab are pending.  We will call if there are any abnormalities.  Please refrain from sexual activity until test results and treatment are complete.  Follow-up with gynecology as well. ? ? ? ?ED Prescriptions   ? ? Medication Sig Dispense Auth. Provider  ? cephALEXin (KEFLEX) 500 MG capsule Take 1 capsule (500 mg total) by mouth 4 (four) times daily. 28 capsule Ervin Knack E, [place]  ? ?  ? ?PDMP not reviewed this encounter. ?  ?Gustavus Bryant, [place] ?12/21/21 1749 ? ?

## 2021-12-21 NOTE — Discharge Instructions (Signed)
It appears that you may have a urinary tract infection causing your symptoms so you are being treated with an antibiotic.  Urine culture and vaginal swab are pending.  We will call if there are any abnormalities.  Please refrain from sexual activity until test results and treatment are complete.  Follow-up with gynecology as well. ?

## 2021-12-22 LAB — CERVICOVAGINAL ANCILLARY ONLY
Bacterial Vaginitis (gardnerella): POSITIVE — AB
Candida Glabrata: NEGATIVE
Candida Vaginitis: NEGATIVE
Chlamydia: NEGATIVE
Comment: NEGATIVE
Comment: NEGATIVE
Comment: NEGATIVE
Comment: NEGATIVE
Comment: NEGATIVE
Comment: NORMAL
Neisseria Gonorrhea: POSITIVE — AB
Trichomonas: NEGATIVE

## 2021-12-23 ENCOUNTER — Ambulatory Visit
Admission: EM | Admit: 2021-12-23 | Discharge: 2021-12-23 | Disposition: A | Discharge status: Home / Self Care | Payer: Medicaid Other | Attending: Internal Medicine | Admitting: Internal Medicine

## 2021-12-23 ENCOUNTER — Other Ambulatory Visit: Payer: Self-pay

## 2021-12-23 ENCOUNTER — Encounter: Payer: Self-pay | Admitting: Emergency Medicine

## 2021-12-23 ENCOUNTER — Telehealth (HOSPITAL_COMMUNITY): Payer: Self-pay | Admitting: Emergency Medicine

## 2021-12-23 DIAGNOSIS — A549 Gonococcal infection, unspecified: Secondary | ICD-10-CM | POA: Diagnosis not present

## 2021-12-23 LAB — URINE CULTURE: Culture: >=100000 — AB

## 2021-12-23 MED ORDER — CEFTRIAXONE SODIUM 1 G IJ SOLR
0.5000 g | Freq: Once | INTRAMUSCULAR | Status: AC
  Administered 2021-12-23: 0.5 g via INTRAMUSCULAR

## 2021-12-23 MED ORDER — METRONIDAZOLE 500 MG PO TABS: 500.0000 mg | ORAL_TABLET | Freq: Two times a day (BID) | ORAL | 0 refills | Status: DC

## 2021-12-23 NOTE — Discharge Instructions (Addendum)
NO SEX FOR ONE WEEK ?

## 2021-12-23 NOTE — Telephone Encounter (Signed)
Patient went home on Keflex.  Will also need treatment with IM Rocephin 500mg  for positive Gonorrhea, per protocol, and metronidazole.   ?Contacted patient by phone.  Verified identity using two identifiers.  Provided positive result.  Reviewed safe sex practices, notifying partners, and refraining from sexual activities for 7 days from time of treatment.  Patient verified understanding, all questions answered.   ?HHS notified ?Verified pharmacy, prescription sent ?

## 2021-12-23 NOTE — ED Triage Notes (Signed)
Patient in department for injection of rocephin, positive test results for gonnorrhea ?

## 2021-12-24 NOTE — ED Provider Notes (Signed)
?[name] ? ? ? ?CSN: [id number] ?Arrival date & time: 12/23/21  1539 ? ? ?  ? ?History   ?Chief Complaint ?Chief Complaint  ?Patient presents with  ? std injection  ? ? ?HPI ?Joanne Aguilar is a 26 y.o. female.  ? ?Pt presented for nurse visit rocephin shot only. Not seen by provider ? ? ? ?Past Medical History:  ?Diagnosis Date  ? Anemia   ? Kidney stones   ? ? ?Patient Active Problem List  ? Diagnosis Date Noted  ? UTI (urinary tract infection) 03/03/2017  ? Breast pain in female 03/03/2017  ? Cervical lesion 03/03/2017  ? Nexplanon removal 01/12/2017  ? Vaginal discharge 12/20/2016  ? Depression 12/20/2016  ? STD exposure 12/20/2016  ? NSVD (normal spontaneous vaginal delivery) 05/09/2015  ? ? ?Past Surgical History:  ?Procedure Laterality Date  ? TOOTH EXTRACTION    ? ? ?OB History   ? ? Gravida  ?2  ? Para  ?1  ? Term  ?1  ? Preterm  ?   ? AB  ?   ? Living  ?1  ?  ? ? SAB  ?   ? IAB  ?   ? Ectopic  ?   ? Multiple  ?0  ? Live Births  ?1  ?   ?  ?  ? ? ? ?Home Medications   ? ?Prior to Admission medications   ?Medication Sig Start Date End Date Taking? Authorizing Provider  ?cephALEXin (KEFLEX) 500 MG capsule Take 1 capsule (500 mg total) by mouth 4 (four) times daily. ?Patient not taking: Reported on 12/23/2021 12/21/21   Gustavus Bryant, FNP  ?metroNIDAZOLE (FLAGYL) 500 MG tablet Take 1 tablet (500 mg total) by mouth 2 (two) times daily. ?Patient not taking: Reported on 12/23/2021 12/23/21   Merrilee Jansky, MD  ? ? ?Family History ?Family History  ?Problem Relation Age of Onset  ? Hypertension Mother   ? Hypertension Maternal Grandmother   ? ? ?Social History ?Social History  ? ?Tobacco Use  ? Smoking status: Some Days  ?  Types: Cigarettes  ? Smokeless tobacco: Never  ? Tobacco comments:  ?  1 cigarettes  ?Vaping Use  ? Vaping Use: Never used  ?Substance Use Topics  ? Alcohol use: Not Currently  ?  Comment: socially  ? Drug use: Not Currently  ?  Types: Marijuana  ?  Comment: None since before  pregnancy  ? ? ? ?Allergies   ?Patient has no known allergies. ? ? ?Review of Systems ?Review of Systems ? ? ?Physical Exam ?Triage Vital Signs ?ED Triage Vitals  ?Enc Vitals Group  ?   BP   ?   Pulse   ?   Resp   ?   Temp   ?   Temp src   ?   SpO2   ?   Weight   ?   Height   ?   Head Circumference   ?   Peak Flow   ?   Pain Score   ?   Pain Loc   ?   Pain Edu?   ?   Excl. in GC?   ? ?No data found. ? ?Updated Vital Signs ?There were no vitals taken for this visit. ? ?Visual Acuity ?Right Eye Distance:   ?Left Eye Distance:   ?Bilateral Distance:   ? ?Right Eye Near:   ?Left Eye Near:    ?Bilateral Near:    ? ?Physical Exam ? ? ?  UC Treatments / Results  ?Labs ?(all labs ordered are listed, but only abnormal results are displayed) ?Labs Reviewed - No data to display ? ?EKG ? ? ?Radiology ?No results found. ? ?Procedures ?Procedures (including critical care time) ? ?Medications Ordered in UC ?Medications  ?cefTRIAXone (ROCEPHIN) injection 0.5 g (0.5 g Intramuscular Given 12/23/21 1700)  ? ? ?Initial Impression / Assessment and Plan / UC Course  ?I have reviewed the triage vital signs and the nursing notes. ? ?Pertinent labs & imaging results that were available during my care of the patient were reviewed by me and considered in my medical decision making (see chart for details). ? ?  ? ?Gonorrhea - IM rocephin administered by nursing staff. PT not evaluated by provider ?Final Clinical Impressions(s) / UC Diagnoses  ? ?Final diagnoses:  ?Gonorrhea  ? ? ? ?Discharge Instructions   ? ?  ?NO SEX FOR ONE WEEK ? ? ? ?ED Prescriptions   ?None ?  ? ?PDMP not reviewed this encounter. ?  ?Maretta Bees, [place] ?12/24/21 0025 ? ?

## 2022-01-17 NOTE — Progress Notes (Signed)
Erroneous encounter-disregard

## 2022-01-18 ENCOUNTER — Encounter: Payer: Medicaid Other | Admitting: Family

## 2022-01-18 DIAGNOSIS — Z113 Encounter for screening for infections with a predominantly sexual mode of transmission: Secondary | ICD-10-CM

## 2022-01-28 ENCOUNTER — Ambulatory Visit
Admission: EM | Admit: 2022-01-28 | Discharge: 2022-01-28 | Disposition: A | Discharge status: Home / Self Care | Payer: Medicaid Other | Attending: Physician Assistant | Admitting: Physician Assistant

## 2022-01-28 ENCOUNTER — Encounter: Payer: Self-pay | Admitting: Emergency Medicine

## 2022-01-28 DIAGNOSIS — L309 Dermatitis, unspecified: Secondary | ICD-10-CM | POA: Diagnosis not present

## 2022-01-28 DIAGNOSIS — Z202 Contact with and (suspected) exposure to infections with a predominantly sexual mode of transmission: Secondary | ICD-10-CM

## 2022-01-28 DIAGNOSIS — N760 Acute vaginitis: Secondary | ICD-10-CM | POA: Diagnosis present

## 2022-01-28 DIAGNOSIS — B9689 Other specified bacterial agents as the cause of diseases classified elsewhere: Secondary | ICD-10-CM | POA: Insufficient documentation

## 2022-01-28 LAB — POCT URINALYSIS DIP (MANUAL ENTRY)
Blood, UA: NEGATIVE
Glucose, UA: NEGATIVE mg/dL
Nitrite, UA: NEGATIVE
Spec Grav, UA: 1.015 (ref 1.010–1.025)
Urobilinogen, UA: 1 E.U./dL
pH, UA: 6.5 (ref 5.0–8.0)

## 2022-01-28 LAB — POCT URINE PREGNANCY: Preg Test, Ur: NEGATIVE

## 2022-01-28 MED ORDER — TRIAMCINOLONE ACETONIDE 0.025 % EX OINT: 1.0000 "application " | TOPICAL_OINTMENT | Freq: Two times a day (BID) | CUTANEOUS | 0 refills | Status: AC

## 2022-01-28 MED ORDER — CEFTRIAXONE SODIUM 250 MG IJ SOLR
500.0000 mg | Freq: Once | INTRAMUSCULAR | Status: AC
  Administered 2022-01-28: 500 mg via INTRAMUSCULAR

## 2022-01-28 MED ORDER — METRONIDAZOLE 500 MG PO TABS: 500.0000 mg | ORAL_TABLET | Freq: Two times a day (BID) | ORAL | 0 refills | Status: AC

## 2022-01-28 MED ORDER — DOXYCYCLINE HYCLATE 100 MG PO CAPS: 100.0000 mg | ORAL_CAPSULE | Freq: Two times a day (BID) | ORAL | 0 refills | Status: AC

## 2022-01-28 NOTE — ED Triage Notes (Addendum)
Patient c/o vaginal bleeding that started yesterday.   Patient endorses ABD cramping at times.   Patient endorses dysuria upon onset of symptoms.   Patient hasn't  used any medications for symptoms.   Patient endorsed LMP 01/21/2022-01/24/2022. A  Patient wants STD Testing.   Patient c/o rash x " my whole life".   Patient states rash "comes and goes".  Patient endorses rash itchiness at times.   Patent has macular rash areas present on upper back.   Patient has used OTC cream with no relief of symptoms.

## 2022-01-28 NOTE — ED Provider Notes (Signed)
[name]    CSN: [id number] Arrival date & time: 01/28/22  1552      History   Chief Complaint Chief Complaint  Patient presents with   Vaginal Bleeding   Rash    HPI Joanne Aguilar is a 26 y.o. female.   Patient complains of vaginal discharge patient reports she recently had gonorrhea and bacterial vaginitis patient reports possible reexposure to gonorrhea and request testing and treatment.  Patient reports she frequently gets bacterial vaginitis and feels like she has it again.  Patient reports that she has a rash on her back patient has a history of eczema she is not currently on any medications for eczema  The history is provided by the patient. No language interpreter was used.  Vaginal Bleeding Rash  Past Medical History:  Diagnosis Date   Anemia    Kidney stones     Patient Active Problem List   Diagnosis Date Noted   UTI (urinary tract infection) 03/03/2017   Breast pain in female 03/03/2017   Cervical lesion 03/03/2017   Nexplanon removal 01/12/2017   Vaginal discharge 12/20/2016   Depression 12/20/2016   STD exposure 12/20/2016   NSVD (normal spontaneous vaginal delivery) 05/09/2015    Past Surgical History:  Procedure Laterality Date   TOOTH EXTRACTION      OB History     Gravida  2   Para  1   Term  1   Preterm      AB      Living  1      SAB      IAB      Ectopic      Multiple  0   Live Births  1            Home Medications    Prior to Admission medications   Medication Sig Start Date End Date Taking? Authorizing Provider  doxycycline (VIBRAMYCIN) 100 MG capsule Take 1 capsule (100 mg total) by mouth 2 (two) times daily. 01/28/22  Yes Cheron Schaumann K, PA-C  triamcinolone (KENALOG) 0.025 % ointment Apply 1 application. topically 2 (two) times daily. 01/28/22  Yes Cheron Schaumann K, PA-C  cephALEXin (KEFLEX) 500 MG capsule Take 1 capsule (500 mg total) by mouth 4 (four) times daily. Patient not taking:  Reported on 12/23/2021 12/21/21   Gustavus Bryant, FNP  metroNIDAZOLE (FLAGYL) 500 MG tablet Take 1 tablet (500 mg total) by mouth 2 (two) times daily. 01/28/22   Elson Areas, PA-C    Family History Family History  Problem Relation Age of Onset   Hypertension Mother    Hypertension Maternal Grandmother     Social History Social History   Tobacco Use   Smoking status: Some Days    Types: Cigarettes   Smokeless tobacco: Never   Tobacco comments:    1 cigarettes  Vaping Use   Vaping Use: Never used  Substance Use Topics   Alcohol use: Not Currently    Comment: socially   Drug use: Not Currently    Types: Marijuana    Comment: None since before pregnancy     Allergies   Patient has no known allergies.   Review of Systems Review of Systems  Genitourinary:  Positive for vaginal bleeding.  Skin:  Positive for rash.  All other systems reviewed and are negative.   Physical Exam Triage Vital Signs ED Triage Vitals  Enc Vitals Group     BP 01/28/22 1718 127/88     Pulse  Rate 01/28/22 1718 87     Resp 01/28/22 1718 16     Temp 01/28/22 1718 98.4 F (36.9 C)     Temp Source 01/28/22 1718 Oral     SpO2 01/28/22 1718 97 %     Weight --      Height --      Head Circumference --      Peak Flow --      Pain Score 01/28/22 1723 0     Pain Loc --      Pain Edu? --      Excl. in GC? --    No data found.  Updated Vital Signs BP 127/88 (BP Location: Left Arm)   Pulse 87   Temp 98.4 F (36.9 C) (Oral)   Resp 16   LMP 01/21/2022 (Exact Date)   SpO2 97%   Breastfeeding No   Visual Acuity Right Eye Distance:   Left Eye Distance:   Bilateral Distance:    Right Eye Near:   Left Eye Near:    Bilateral Near:     Physical Exam Vitals and nursing note reviewed.  Constitutional:      Appearance: She is well-developed.  HENT:     Head: Normocephalic.  Cardiovascular:     Rate and Rhythm: Normal rate.  Pulmonary:     Effort: Pulmonary effort is normal.   Abdominal:     General: There is no distension.  Musculoskeletal:        General: Normal range of motion.     Cervical back: Normal range of motion.  Skin:    Comments: Right eye erythematous scaly rash right and left upper back  Neurological:     General: No focal deficit present.     Mental Status: She is alert and oriented to person, place, and time.  Psychiatric:        Mood and Affect: Mood normal.     UC Treatments / Results  Labs (all labs ordered are listed, but only abnormal results are displayed) Labs Reviewed  POCT URINALYSIS DIP (MANUAL ENTRY) - Abnormal; Notable for the following components:      Result Value   Bilirubin, UA small (*)    Ketones, POC UA large (80) (*)    Protein Ur, POC trace (*)    Leukocytes, UA Trace (*)    All other components within normal limits  POCT URINE PREGNANCY  CERVICOVAGINAL ANCILLARY ONLY    EKG   Radiology No results found.  Procedures Procedures (including critical care time)  Medications Ordered in UC Medications  cefTRIAXone (ROCEPHIN) injection 500 mg (has no administration in time range)    Initial Impression / Assessment and Plan / UC Course  I have reviewed the triage vital signs and the nursing notes.  Pertinent labs & imaging results that were available during my care of the patient were reviewed by me and considered in my medical decision making (see chart for details).     MDM: UA shows leukocytes suspect this is contamination from vaginal discharge patient is given a prescription for doxycycline and Flagyl she is given a prescription for triamcinolone for her skin patient is given an injection of Rocephin here for possible gonorrhea exposure patient is given discharge instructions on safe sex Final Clinical Impressions(s) / UC Diagnoses   Final diagnoses:  BV (bacterial vaginosis)  STD exposure  Eczema, unspecified type   Discharge Instructions   None    ED Prescriptions     Medication Sig  Dispense Auth. Provider   metroNIDAZOLE (FLAGYL) 500 MG tablet Take 1 tablet (500 mg total) by mouth 2 (two) times daily. 14 tablet Cheron Schaumann K, [place]   doxycycline (VIBRAMYCIN) 100 MG capsule Take 1 capsule (100 mg total) by mouth 2 (two) times daily. 14 capsule Naji Mehringer K, [place]   triamcinolone (KENALOG) 0.025 % ointment Apply 1 application. topically 2 (two) times daily. 80 g Elson Areas, [place]      PDMP not reviewed this encounter. An After Visit Summary was printed and given to the patient.    Elson Areas, [place] 01/28/22 0998

## 2022-02-01 LAB — CERVICOVAGINAL ANCILLARY ONLY
Bacterial Vaginitis (gardnerella): POSITIVE — AB
Candida Glabrata: NEGATIVE
Candida Vaginitis: NEGATIVE
Chlamydia: NEGATIVE
Comment: NEGATIVE
Comment: NEGATIVE
Comment: NEGATIVE
Comment: NEGATIVE
Comment: NEGATIVE
Comment: NORMAL
Neisseria Gonorrhea: POSITIVE — AB
Trichomonas: NEGATIVE

## 2022-05-20 ENCOUNTER — Emergency Department (HOSPITAL_COMMUNITY)
Admission: EM | Admit: 2022-05-20 | Discharge: 2022-05-20 | Disposition: A | Discharge status: Home / Self Care | Payer: Medicaid Other | Attending: Emergency Medicine | Admitting: Emergency Medicine

## 2022-05-20 DIAGNOSIS — Z5321 Procedure and treatment not carried out due to patient leaving prior to being seen by health care provider: Secondary | ICD-10-CM | POA: Diagnosis not present

## 2022-05-20 DIAGNOSIS — K0889 Other specified disorders of teeth and supporting structures: Secondary | ICD-10-CM | POA: Diagnosis present

## 2022-05-20 MED ORDER — OXYCODONE-ACETAMINOPHEN 5-325 MG PO TABS
1.0000 | ORAL_TABLET | Freq: Once | ORAL | Status: AC
  Administered 2022-05-20: 1 via ORAL
  Filled 2022-05-20: qty 1

## 2022-05-20 NOTE — ED Provider Triage Note (Signed)
Emergency Medicine Provider Triage Evaluation Note  Grissel Tyrell , a 26 y.o. female  was evaluated in triage.  Pt complains of dental pain of the back left maxillary molar that started about 2 days ago and significantly worsened.  She thinks that she may have broken the tooth.  She is quite distressed in triage and crying.  Denies fevers, chills.  Review of Systems  Positive:  Negative:   Physical Exam  BP (!) 161/117 (BP Location: Right Arm)   Pulse 83   Temp 98.6 F (37 C) (Oral)   Resp 16   SpO2 100%  Gen:   Awake, distressed, writhing in pain Resp:  Normal effort  MSK:   Moves extremities without difficulty  Other:  No obvious fracture or abscess  Medical Decision Making  Medically screening exam initiated at 10:27 AM.  Appropriate orders placed.  Yahaira Bruski was informed that the remainder of the evaluation will be completed by another provider, this initial triage assessment does not replace that evaluation, and the importance of remaining in the ED until their evaluation is complete.     Janell Quiet, [place] 05/20/22 1028

## 2022-05-20 NOTE — ED Triage Notes (Addendum)
Pt c/o left sided dental pain that is ongoing for a couple of months with pain radiating to head. Pt reports a hole in the tooth on the upper left side. Has tried to get tx but can not get into a dentist. Pt is tearful in triage but able to speak in full sentences.

## 2022-08-26 ENCOUNTER — Emergency Department (HOSPITAL_COMMUNITY)
Admission: EM | Admit: 2022-08-26 | Discharge: 2022-08-26 | Disposition: A | Discharge status: Home / Self Care | Payer: Medicaid Other | Attending: Emergency Medicine | Admitting: Emergency Medicine

## 2022-08-26 ENCOUNTER — Emergency Department (HOSPITAL_COMMUNITY): Payer: Medicaid Other

## 2022-08-26 ENCOUNTER — Encounter (HOSPITAL_COMMUNITY): Payer: Self-pay

## 2022-08-26 DIAGNOSIS — R1011 Right upper quadrant pain: Secondary | ICD-10-CM | POA: Insufficient documentation

## 2022-08-26 DIAGNOSIS — R109 Unspecified abdominal pain: Secondary | ICD-10-CM

## 2022-08-26 DIAGNOSIS — N760 Acute vaginitis: Secondary | ICD-10-CM | POA: Diagnosis not present

## 2022-08-26 DIAGNOSIS — B9689 Other specified bacterial agents as the cause of diseases classified elsewhere: Secondary | ICD-10-CM | POA: Diagnosis not present

## 2022-08-26 DIAGNOSIS — E876 Hypokalemia: Secondary | ICD-10-CM | POA: Diagnosis not present

## 2022-08-26 DIAGNOSIS — D649 Anemia, unspecified: Secondary | ICD-10-CM | POA: Insufficient documentation

## 2022-08-26 DIAGNOSIS — R1031 Right lower quadrant pain: Secondary | ICD-10-CM | POA: Diagnosis not present

## 2022-08-26 LAB — URINALYSIS, ROUTINE W REFLEX MICROSCOPIC
Bilirubin Urine: NEGATIVE
Glucose, UA: NEGATIVE mg/dL
Hgb urine dipstick: NEGATIVE
Ketones, ur: NEGATIVE mg/dL
Leukocytes,Ua: NEGATIVE
Nitrite: NEGATIVE
Protein, ur: NEGATIVE mg/dL
Specific Gravity, Urine: 1.039 — ABNORMAL HIGH (ref 1.005–1.030)
pH: 8 (ref 5.0–8.0)

## 2022-08-26 LAB — CBC WITH DIFFERENTIAL/PLATELET
Abs Immature Granulocytes: 0.03 10*3/uL (ref 0.00–0.07)
Basophils Absolute: 0 10*3/uL (ref 0.0–0.1)
Basophils Relative: 0 %
Eosinophils Absolute: 0.2 10*3/uL (ref 0.0–0.5)
Eosinophils Relative: 2 %
HCT: 35.8 % — ABNORMAL LOW (ref 36.0–46.0)
Hemoglobin: 11.4 g/dL — ABNORMAL LOW (ref 12.0–15.0)
Immature Granulocytes: 0 %
Lymphocytes Relative: 17 %
Lymphs Abs: 1.7 10*3/uL (ref 0.7–4.0)
MCH: 27.9 pg (ref 26.0–34.0)
MCHC: 31.8 g/dL (ref 30.0–36.0)
MCV: 87.5 fL (ref 80.0–100.0)
Monocytes Absolute: 0.8 10*3/uL (ref 0.1–1.0)
Monocytes Relative: 8 %
Neutro Abs: 7.4 10*3/uL (ref 1.7–7.7)
Neutrophils Relative %: 73 %
Platelets: 172 10*3/uL (ref 150–400)
RBC: 4.09 MIL/uL (ref 3.87–5.11)
RDW: 15.5 % (ref 11.5–15.5)
WBC: 10.1 10*3/uL (ref 4.0–10.5)
nRBC: 0 % (ref 0.0–0.2)

## 2022-08-26 LAB — COMPREHENSIVE METABOLIC PANEL
ALT: 9 U/L (ref 0–44)
AST: 12 U/L — ABNORMAL LOW (ref 15–41)
Albumin: 3.5 g/dL (ref 3.5–5.0)
Alkaline Phosphatase: 54 U/L (ref 38–126)
Anion gap: 10 (ref 5–15)
BUN: <5 mg/dL — ABNORMAL LOW (ref 6–20)
CO2: 22 mmol/L (ref 22–32)
Calcium: 8.5 mg/dL — ABNORMAL LOW (ref 8.9–10.3)
Chloride: 107 mmol/L (ref 98–111)
Creatinine, Ser: 0.51 mg/dL (ref 0.44–1.00)
GFR, Estimated: >60 mL/min (ref >60)
Glucose, Bld: 95 mg/dL (ref 70–99)
Potassium: 3.1 mmol/L — ABNORMAL LOW (ref 3.5–5.1)
Sodium: 139 mmol/L (ref 135–145)
Total Bilirubin: 0.7 mg/dL (ref 0.3–1.2)
Total Protein: 6.9 g/dL (ref 6.5–8.1)

## 2022-08-26 LAB — WET PREP, GENITAL
Sperm: NONE SEEN
Trich, Wet Prep: NONE SEEN
WBC, Wet Prep HPF POC: <10 (ref <10)
Yeast Wet Prep HPF POC: NONE SEEN

## 2022-08-26 LAB — HCG, QUANTITATIVE, PREGNANCY: hCG, Beta Chain, Quant, S: <1 m[IU]/mL (ref <5)

## 2022-08-26 LAB — LIPASE, BLOOD: Lipase: 32 U/L (ref 11–51)

## 2022-08-26 MED ORDER — METRONIDAZOLE 0.75 % VA GEL: 1.0000 | Freq: Every day | VAGINAL | 0 refills | Status: AC

## 2022-08-26 MED ORDER — METRONIDAZOLE 500 MG PO TABS: 500.0000 mg | ORAL_TABLET | Freq: Two times a day (BID) | ORAL | 0 refills | Status: AC

## 2022-08-26 MED ORDER — DOXYCYCLINE HYCLATE 100 MG PO CAPS: 100.0000 mg | ORAL_CAPSULE | Freq: Two times a day (BID) | ORAL | 0 refills | Status: AC

## 2022-08-26 MED ORDER — ONDANSETRON HCL 4 MG/2ML IJ SOLN
4.0000 mg | Freq: Once | INTRAMUSCULAR | Status: AC
  Administered 2022-08-26: 4 mg via INTRAVENOUS
  Filled 2022-08-26: qty 2

## 2022-08-26 MED ORDER — DICYCLOMINE HCL 20 MG PO TABS: 20.0000 mg | ORAL_TABLET | Freq: Two times a day (BID) | ORAL | 0 refills | Status: AC | PRN

## 2022-08-26 MED ORDER — SODIUM CHLORIDE 0.9 % IV SOLN
1.0000 g | Freq: Once | INTRAVENOUS | Status: AC
  Administered 2022-08-26: 1 g via INTRAVENOUS
  Filled 2022-08-26: qty 10

## 2022-08-26 MED ORDER — IOHEXOL 300 MG/ML  SOLN
100.0000 mL | Freq: Once | INTRAMUSCULAR | Status: AC | PRN
  Administered 2022-08-26: 100 mL via INTRAVENOUS

## 2022-08-26 MED ORDER — KETOROLAC TROMETHAMINE 30 MG/ML IJ SOLN
30.0000 mg | Freq: Once | INTRAMUSCULAR | Status: AC
  Administered 2022-08-26: 30 mg via INTRAVENOUS
  Filled 2022-08-26: qty 1

## 2022-08-26 MED ORDER — LACTATED RINGERS IV BOLUS
1000.0000 mL | Freq: Once | INTRAVENOUS | Status: AC
  Administered 2022-08-26: 1000 mL via INTRAVENOUS

## 2022-08-26 MED ORDER — ONDANSETRON 4 MG PO TBDP
4.0000 mg | ORAL_TABLET | Freq: Three times a day (TID) | ORAL | 0 refills | Status: AC | PRN
Start: 2022-08-26 — End: ?

## 2022-08-26 MED ORDER — POTASSIUM CHLORIDE CRYS ER 20 MEQ PO TBCR
40.0000 meq | EXTENDED_RELEASE_TABLET | Freq: Once | ORAL | Status: AC
  Administered 2022-08-26: 40 meq via ORAL
  Filled 2022-08-26: qty 2

## 2022-08-26 MED ORDER — METRONIDAZOLE 500 MG PO TABS
500.0000 mg | ORAL_TABLET | Freq: Once | ORAL | Status: AC
  Administered 2022-08-26: 500 mg via ORAL
  Filled 2022-08-26: qty 1

## 2022-08-26 NOTE — ED Provider Notes (Signed)
[facility]-EMERGENCY DEPT Provider Note   CSN: [id number] Arrival date & time: 08/26/22  0814     History  Chief Complaint  Patient presents with   Abdominal Pain    Joanne Aguilar is a 25 y.o. female.  With PMH of nephrolithiasis, BV, and previous history of gonorrhea which was treated presenting with right-sided flank and abdominal pain ongoing for the past several days with vomiting.  Patient says that her symptoms started about a week ago.  She has had atraumatic right upper quadrant pain that sometimes goes into her right shoulder and back as well as right lower quadrant abdominal pain that is worse with eating and going to the bathroom.  She has had 1 episode of nonbloody nonbilious emesis.  She has had no fevers, no diarrhea, no dysuria, no hematuria or vaginal discharge burning or itching.  She does note ongoing intermittent vaginal bleeding and spotting for approximately the past 20 days and has had a new sexual partner with no contraception use over the past 2 to 3 months.  She was concerned she could have gallbladder issues but has had no history of gallstones.  She does note history of kidney stone.  She has had no history of PE or DVT.  She has taken ibuprofen at home without relief of symptoms.  She is requesting coverage for STD.   Abdominal Pain      Home Medications Prior to Admission medications   Medication Sig Start Date End Date Taking? Authorizing Provider  dicyclomine (BENTYL) 20 MG tablet Take 1 tablet (20 mg total) by mouth 2 (two) times daily as needed (abdominal cramping). 08/26/22  Yes Mardene Sayer, MD  doxycycline (VIBRAMYCIN) 100 MG capsule Take 1 capsule (100 mg total) by mouth 2 (two) times daily for 7 days. 08/26/22 09/02/22 Yes Mardene Sayer, MD  metroNIDAZOLE (FLAGYL) 500 MG tablet Take 1 tablet (500 mg total) by mouth 2 (two) times daily. 08/26/22  Yes Mardene Sayer, MD  metroNIDAZOLE (METROGEL) 0.75 % vaginal  gel Place 1 Applicatorful vaginally daily for 5 days. 08/26/22 08/31/22 Yes Mardene Sayer, MD  ondansetron (ZOFRAN-ODT) 4 MG disintegrating tablet Take 1 tablet (4 mg total) by mouth every 8 (eight) hours as needed for nausea or vomiting. 08/26/22  Yes Mardene Sayer, MD  cephALEXin (KEFLEX) 500 MG capsule Take 1 capsule (500 mg total) by mouth 4 (four) times daily. Patient not taking: Reported on 12/23/2021 12/21/21   Gustavus Bryant, FNP  doxycycline (VIBRAMYCIN) 100 MG capsule Take 1 capsule (100 mg total) by mouth 2 (two) times daily. 01/28/22   Elson Areas, PA-C  metroNIDAZOLE (FLAGYL) 500 MG tablet Take 1 tablet (500 mg total) by mouth 2 (two) times daily. 01/28/22   Elson Areas, PA-C  triamcinolone (KENALOG) 0.025 % ointment Apply 1 application. topically 2 (two) times daily. 01/28/22   Elson Areas, PA-C      Allergies    Patient has no known allergies.    Review of Systems   Review of Systems  Gastrointestinal:  Positive for abdominal pain.    Physical Exam Updated Vital Signs BP 129/85 (BP Location: Left Arm)   Pulse 89   Temp 98.3 F (36.8 C) (Oral)   Resp 18   SpO2 100%  Physical Exam Constitutional: Alert and oriented. Well appearing and in no distress. Eyes: Conjunctivae are normal. ENT      Head: Normocephalic and atraumatic.      Nose: No congestion.  Mouth/Throat: Mucous membranes are moist.      Neck: No stridor. Cardiovascular: S1, S2, regular rate and rhythm warm well-perfused. Respiratory: Normal respiratory effort. Breath sounds are normal. O2 sat100 on RA Gastrointestinal: Soft and nondistended with mild right upper quadrant tenderness and right lower quadrant tenderness no rebound or guarding no CVA tenderness Musculoskeletal: Normal range of motion in all extremities. No pitting edema or tenderness of lower extremities Neurologic: Normal speech and language.  Skin: Skin is warm, dry and intact. No rash noted. Psychiatric: Mood and  affect are normal. Speech and behavior are normal.  ED Results / Procedures / Treatments   Labs (all labs ordered are listed, but only abnormal results are displayed) Labs Reviewed  WET PREP, GENITAL - Abnormal; Notable for the following components:      Result Value   Clue Cells Wet Prep HPF POC PRESENT (*)    All other components within normal limits  COMPREHENSIVE METABOLIC PANEL - Abnormal; Notable for the following components:   Potassium 3.1 (*)    BUN <5 (*)    Calcium 8.5 (*)    AST 12 (*)    All other components within normal limits  CBC WITH DIFFERENTIAL/PLATELET - Abnormal; Notable for the following components:   Hemoglobin 11.4 (*)    HCT 35.8 (*)    All other components within normal limits  URINALYSIS, ROUTINE W REFLEX MICROSCOPIC - Abnormal; Notable for the following components:   Color, Urine COLORLESS (*)    Specific Gravity, Urine 1.039 (*)    All other components within normal limits  LIPASE, BLOOD  HCG, QUANTITATIVE, PREGNANCY  GC/CHLAMYDIA PROBE AMP ([place]) NOT AT [facility]    EKG None  Radiology CT ABDOMEN PELVIS W CONTRAST  Result Date: 08/26/2022 CLINICAL DATA:  Acute right flank pain. EXAM: CT ABDOMEN AND PELVIS WITH CONTRAST TECHNIQUE: Multidetector CT imaging of the abdomen and pelvis was performed using the standard protocol following bolus administration of intravenous contrast. RADIATION DOSE REDUCTION: This exam was performed according to the departmental dose-optimization program which includes automated exposure control, adjustment of the mA and/or kV according to patient size and/or use of iterative reconstruction technique. CONTRAST:  OMNIPAQUE IOHEXOL 300 MG/ML  SOLN COMPARISON:  July 29, 2014. FINDINGS: Lower chest: No acute abnormality. Hepatobiliary: No focal liver abnormality is seen. No gallstones, gallbladder wall thickening, or biliary dilatation. Pancreas: Unremarkable. No pancreatic ductal dilatation or surrounding  inflammatory changes. Spleen: Normal in size without focal abnormality. Adrenals/Urinary Tract: Adrenal glands are unremarkable. Kidneys are normal, without renal calculi, focal lesion, or hydronephrosis. Bladder is unremarkable. Stomach/Bowel: The stomach is unremarkable. The appendix is unremarkable. No colonic dilatation is noted. There appears to be a significant amount of inflammatory stranding in the left lower quadrant of uncertain etiology. Potentially there is mild small bowel wall thickening in this area suggesting possible enteritis. Vascular/Lymphatic: No significant vascular findings are present. No enlarged abdominal or pelvic lymph nodes. Reproductive: Uterus and bilateral adnexa are unremarkable. Other: No hernia is noted. Mild amount of free fluid is noted posteriorly in the pelvis which may be physiologic, but alternatively may be secondary to previously described inflammatory findings. Musculoskeletal: No acute or significant osseous findings. IMPRESSION: There appears to be a significant amount of inflammatory stranding in the left lower quadrant of the abdomen of uncertain etiology. There also appears to be a mild amount of free fluid in the pelvis posteriorly which may be physiologic, but potentially may be related to inflammatory process described above. The visualized  appendix is unremarkable. There may be mild wall thickening involving small bowel loops in the pelvic region potentially reflecting enteritis. Electronically Signed   By: Lupita Raider M.D.   On: 08/26/2022 12:28    Procedures Procedures  Remain on constant cardiac monitoring which I personally reviewed normal sinus rhythm with normal rates.  Medications Ordered in ED Medications  ketorolac (TORADOL) 30 MG/ML injection 30 mg (30 mg Intravenous Given 08/26/22 1020)  ondansetron (ZOFRAN) injection 4 mg (4 mg Intravenous Given 08/26/22 1020)  lactated ringers bolus 1,000 mL (0 mLs Intravenous Stopped 08/26/22 1232)   cefTRIAXone (ROCEPHIN) 1 g in sodium chloride 0.9 % 100 mL IVPB (0 g Intravenous Stopped 08/26/22 1057)  potassium chloride SA (KLOR-CON M) CR tablet 40 mEq (40 mEq Oral Given 08/26/22 1126)  iohexol (OMNIPAQUE) 300 MG/ML solution 100 mL (100 mLs Intravenous Contrast Given 08/26/22 1153)  metroNIDAZOLE (FLAGYL) tablet 500 mg (500 mg Oral Given 08/26/22 1231)    ED Course/ Medical Decision Making/ A&P                           Medical Decision Making Latanza Pfefferkorn is a 26 y.o. female.  With PMH of nephrolithiasis, BV, and previous history of gonorrhea which was treated presenting with right-sided flank and abdominal pain ongoing for the past several days with vomiting.    Based on the patient's right upper quadrant pain and RLQ pain, differential includes but is not limited to cholelithiasis, cholecystitis, hepatitis, GERD, PUD, Fitz hugh curtis/STD, pancreatitis, appendicitis. Less likely nephrolithiasis or pyelonephritis with no CVAT and no urinary symptoms. Less likely pulmonary source such as pneumonia with no cardiopulmonary complaints, no hypoxia, and no increased work of breathing. PE unlikely as patient PERC negative.   Labs obtained which I personally reviewed which showed normal white blood cell count 10.1.  Stable chronic anemia hemoglobin 11.4.  No transaminitis.  Lipase 32 within normal limits, not consistent with acute pancreatitis.  Mild hypokalemia 3.1.  Repleted in ED.  Creatinine 0.51 within normal limits.  Patient tested positive for BV.  STD swabs ordered and patient empirically covered with IV Rocephin in ED and sent home with doxycycline x 7 days and Flagyl BID x 7 days.   CTAP with IV contrast obtained which I personally reviewed which showed generally no acute intra-abdominal pathology.  There was small stranding in the left lower quadrant of uncertain etiology possible enteritis and mild amount of free fluid in the pelvis.  Likely physiologic.  She has no left lower  quadrant tenderness on exam.  Her appendix and ovaries were unremarkable on CT.  Unlikely TOA.  Updated patient on the results of the CT.  She is tolerating p.o. and pain is under control.  We are covering patient empirically for possible STD due to new sexual partners she received IV Rocephin and discharged with 7 days doxycycline and 7 days Flagyl.  Also received Bentyl and Zofran and referral to OB/GYN and return precautions.  She is in agreement with plan and safe for discharge.     Amount and/or Complexity of Data Reviewed Labs: ordered. Radiology: ordered.  Risk Prescription drug management.    Final Clinical Impression(s) / ED Diagnoses Final diagnoses:  Abdominal pain, unspecified abdominal location  Hypokalemia  Bacterial vaginosis    Rx / DC Orders ED Discharge Orders          Ordered    doxycycline (VIBRAMYCIN) 100 MG capsule  2 times daily  08/26/22 1302    metroNIDAZOLE (FLAGYL) 500 MG tablet  2 times daily        08/26/22 1302    ondansetron (ZOFRAN-ODT) 4 MG disintegrating tablet  Every 8 hours PRN        08/26/22 1302    dicyclomine (BENTYL) 20 MG tablet  2 times daily PRN        08/26/22 1302    metroNIDAZOLE (METROGEL) 0.75 % vaginal gel  Daily        08/26/22 1303              Mardene SayerBranham, Jacqualine Weichel C, MD 08/26/22 1321

## 2022-08-26 NOTE — ED Triage Notes (Signed)
Pt arrived via POV, c/o right sided flank and abd pain for several days. Denies any diarrhea, states one episode of vomiting this morning. Denies any urinary sx.

## 2022-08-26 NOTE — Discharge Instructions (Signed)
  You have been seen in the Emergency Department (ED) for abdominal pain. Your workup was generally reassuring; however, it did not identify a clear cause of your symptoms.  Follow-up with your MyChart results for the STD testing.  We have given you medication to take for BV, you can either choose to use the vaginal gel or the tablet (Flagyl).  You do not need to use both but as we discussed do not take the Flagyl tablet with alcohol.  Take the doxycycline as prescribed for continued coverage of STD.  Make sure to have your partner get tested and use protection especially if your tests are positive for STD.  You can take Tylenol and ibuprofen as needed for abdominal pain as well as the Bentyl prescribed.  You can take the Zofran as needed for nausea or vomiting.  Please follow up with your primary care doctor as soon as possible regarding today's ED visit and the symptoms that are bothering you.  We have included information for local OB/GYN for follow-up.  Return to the ED if your abdominal pain worsens or fails to improve, you develop bloody vomiting, bloody diarrhea, you are unable to tolerate fluids due to vomiting, fever greater than 101, or any other concerning symptoms.

## 2022-08-30 LAB — GC/CHLAMYDIA PROBE AMP (~~LOC~~) NOT AT ARMC
Chlamydia: POSITIVE — AB
Comment: NEGATIVE
Comment: NORMAL
Neisseria Gonorrhea: POSITIVE — AB

## 2023-02-23 ENCOUNTER — Encounter: Payer: Medicaid Other | Admitting: Obstetrics and Gynecology

## 2023-03-29 ENCOUNTER — Encounter: Payer: Medicaid Other | Admitting: Obstetrics & Gynecology
